# Patient Record
Sex: Female | Born: 1985 | Race: Black or African American | Hispanic: No | Marital: Married | Smoking: Never smoker
Health system: Southern US, Community
[De-identification: ages and names within clinical notes are randomized; demographics above are authoritative.]

## PROBLEM LIST (undated history)

## (undated) DIAGNOSIS — Z803 Family history of malignant neoplasm of breast: Secondary | ICD-10-CM

## (undated) DIAGNOSIS — G43909 Migraine, unspecified, not intractable, without status migrainosus: Secondary | ICD-10-CM

## (undated) HISTORY — PX: HERNIA REPAIR: SHX51

## (undated) HISTORY — DX: Family history of malignant neoplasm of breast: Z80.3

---

## 2010-02-10 ENCOUNTER — Emergency Department (HOSPITAL_COMMUNITY): Admission: EM | Admit: 2010-02-10 | Discharge: 2010-02-11 | Payer: Self-pay | Admitting: Emergency Medicine

## 2010-05-13 ENCOUNTER — Emergency Department (HOSPITAL_COMMUNITY)
Admission: EM | Admit: 2010-05-13 | Discharge: 2010-05-13 | Payer: Self-pay | Source: Home / Self Care | Admitting: Family Medicine

## 2010-08-25 LAB — POCT PREGNANCY, URINE: Preg Test, Ur: NEGATIVE

## 2010-08-25 LAB — POCT I-STAT, CHEM 8
BUN: 8 mg/dL (ref 6–23)
Chloride: 105 mEq/L (ref 96–112)
Sodium: 140 mEq/L (ref 135–145)

## 2011-01-31 IMAGING — CR DG CHEST 2V
2 series · 2 of 2 positions shown · non-contrast
Comparison: None.

CLINICAL DATA: Chest pain for 2 days.

CHEST - 2 VIEW

[w chest pa]
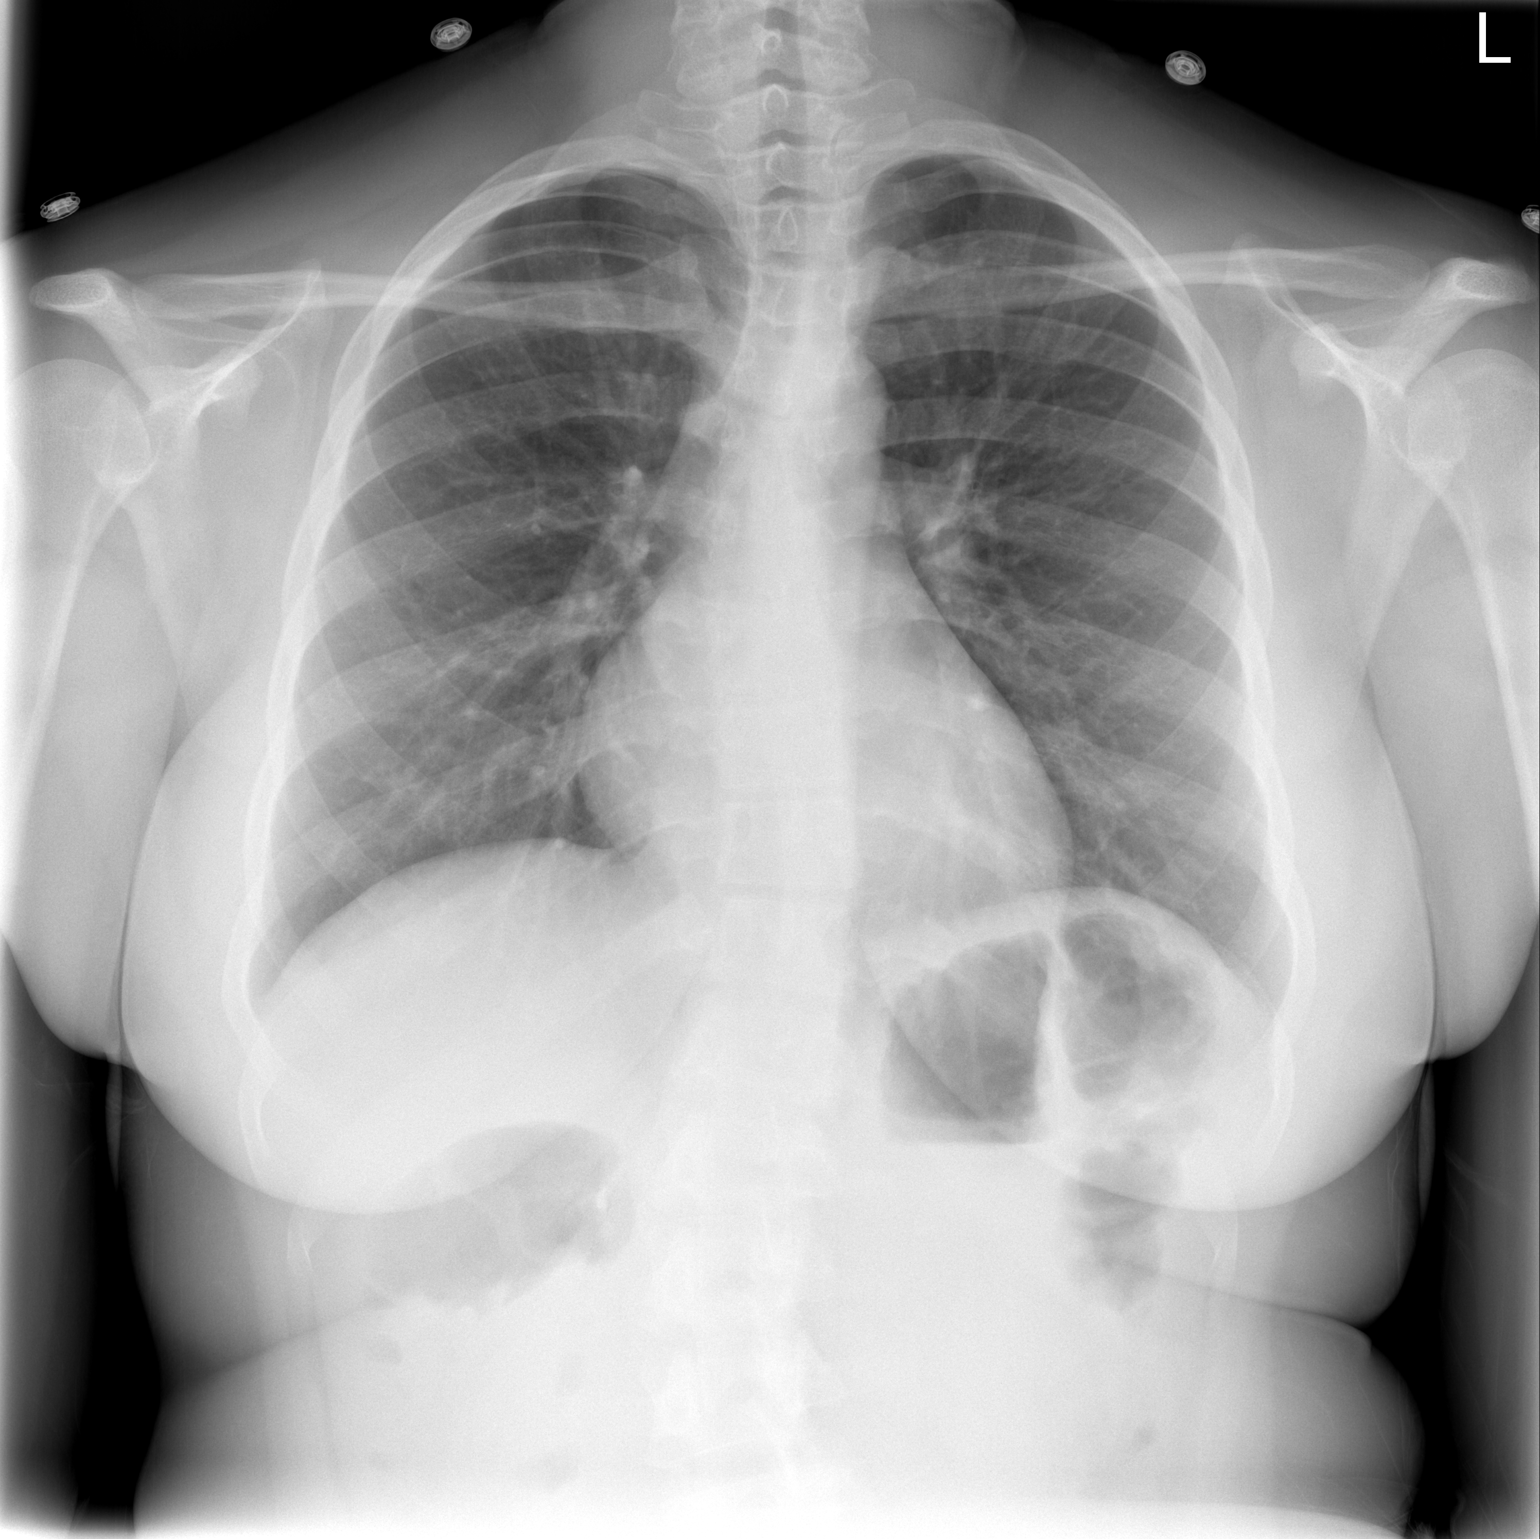

[w chest lat]
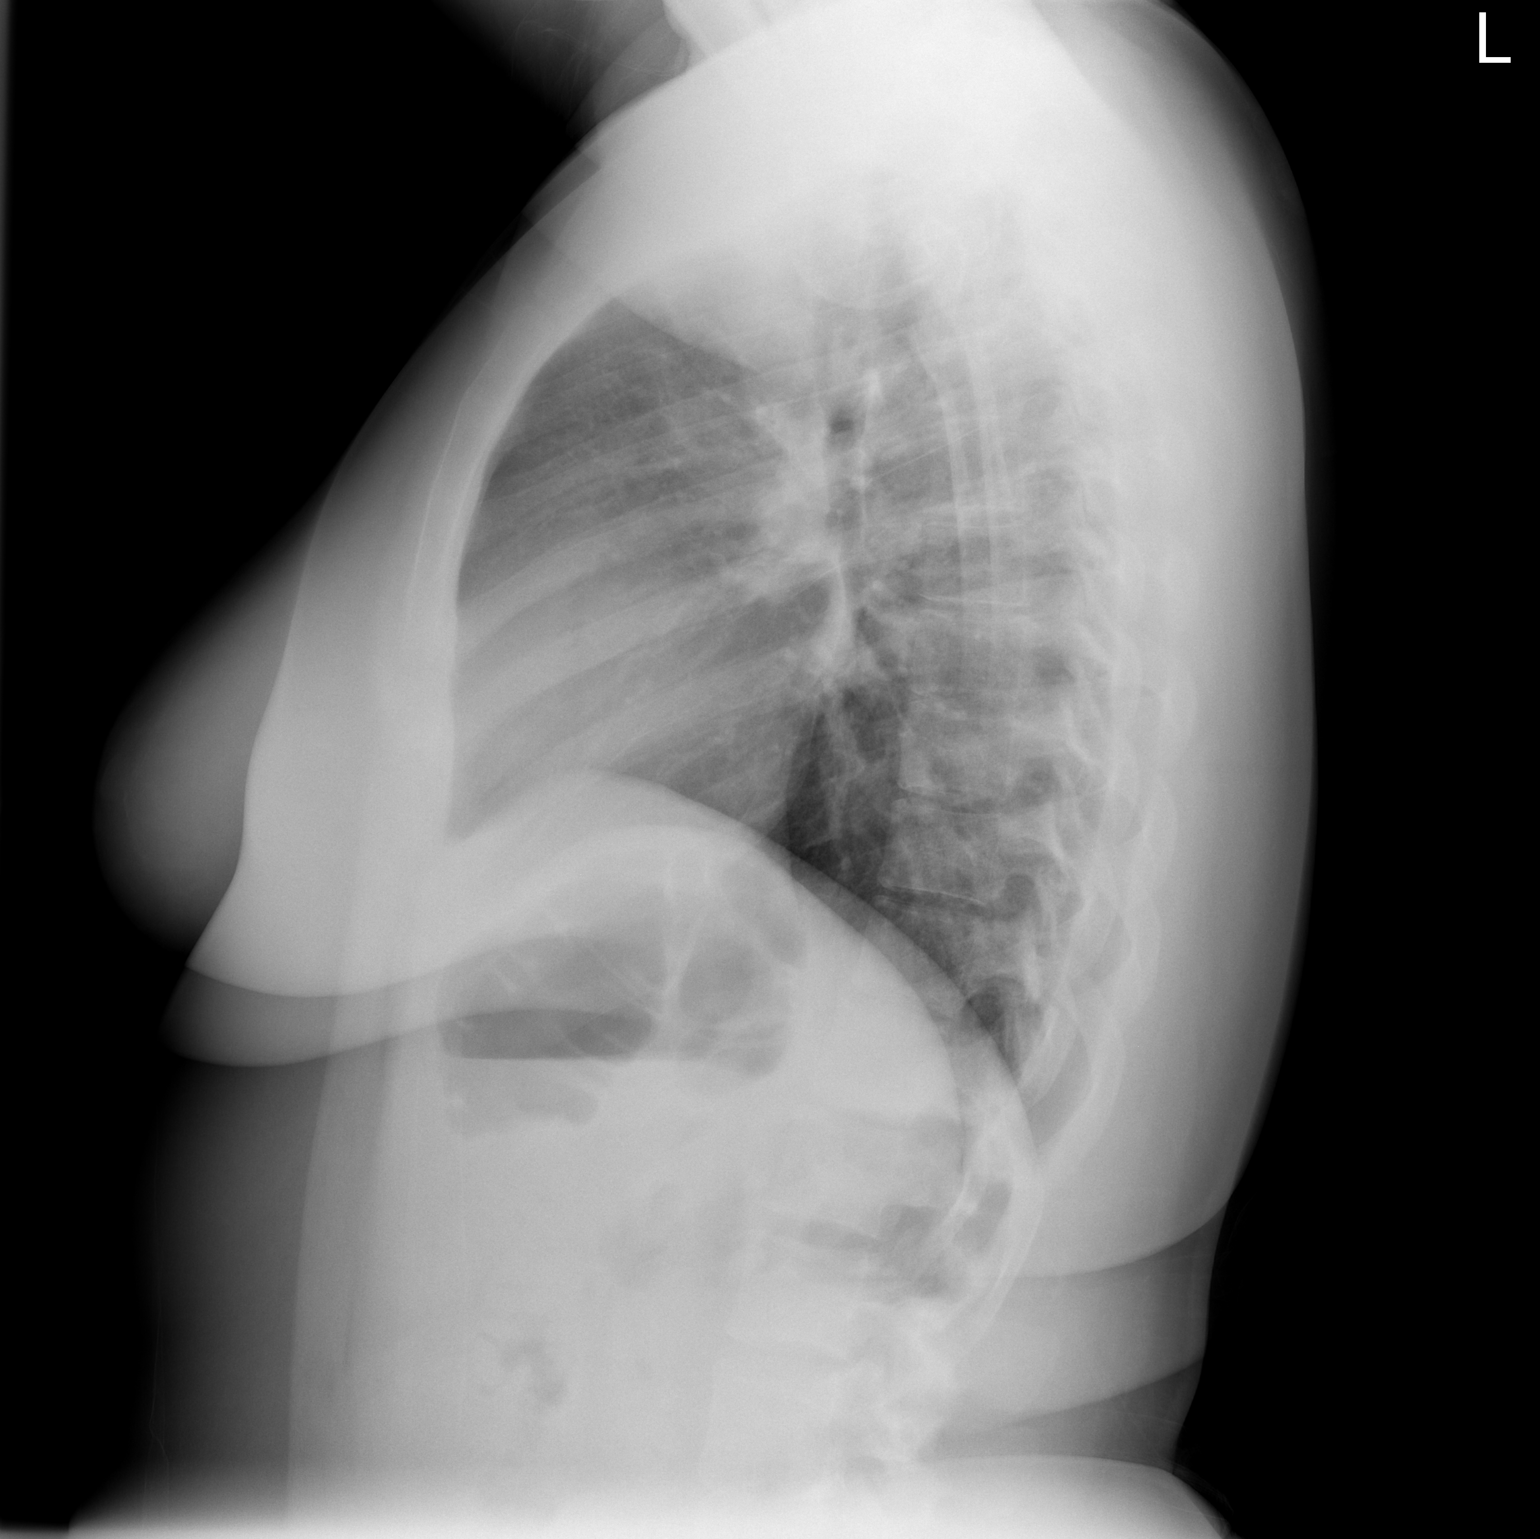

[2 of 2 positions shown; findings below may reference images not displayed]

FINDINGS: The heart size and mediastinal contours are normal. The
lungs are clear. There is no pleural effusion or pneumothorax. No
acute osseous findings are identified.
IMPRESSION: No active cardiopulmonary process.

## 2012-05-23 ENCOUNTER — Other Ambulatory Visit (HOSPITAL_COMMUNITY)
Admission: RE | Admit: 2012-05-23 | Discharge: 2012-05-23 | Disposition: A | Discharge status: Home / Self Care | Payer: Self-pay | Source: Ambulatory Visit | Attending: Emergency Medicine | Admitting: Emergency Medicine

## 2012-05-23 ENCOUNTER — Encounter (HOSPITAL_COMMUNITY): Payer: Self-pay | Admitting: Emergency Medicine

## 2012-05-23 ENCOUNTER — Emergency Department (INDEPENDENT_AMBULATORY_CARE_PROVIDER_SITE_OTHER)
Admission: EM | Admit: 2012-05-23 | Discharge: 2012-05-23 | Disposition: A | Discharge status: Home / Self Care | Payer: Self-pay | Source: Home / Self Care | Attending: Emergency Medicine | Admitting: Emergency Medicine

## 2012-05-23 DIAGNOSIS — Z113 Encounter for screening for infections with a predominantly sexual mode of transmission: Secondary | ICD-10-CM | POA: Insufficient documentation

## 2012-05-23 DIAGNOSIS — N760 Acute vaginitis: Secondary | ICD-10-CM

## 2012-05-23 MED ORDER — FLUCONAZOLE 150 MG PO TABS: 150.0000 mg | ORAL_TABLET | Freq: Once | ORAL | Status: DC

## 2012-05-23 MED ORDER — METRONIDAZOLE 500 MG PO TABS: 500.0000 mg | ORAL_TABLET | Freq: Two times a day (BID) | ORAL | Status: DC

## 2012-05-23 NOTE — ED Notes (Signed)
Patient in gown and equipment at bedside

## 2012-05-23 NOTE — ED Provider Notes (Signed)
Chief Complaint  Patient presents with  . Vaginal Pain    History of Present Illness:   The patient is a 26 year old female who has had a one-week history of vaginal irritation, burning, and itching. She denies any vaginal or pelvic pain, lower back pain, fever, chills, nausea, vomiting, or urinary symptoms. Her menses have been regular. Last menstrual period was 4 days ago. She is sexually active with use of condoms.  Review of Systems:  Other than noted above, the patient denies any of the following symptoms: Systemic:  No fever, chills, sweats, fatigue, or weight loss. GI:  No abdominal pain, nausea, anorexia, vomiting, diarrhea, constipation, melena or hematochezia. GU:  No dysuria, frequency, urgency, hematuria, vaginal discharge, itching, or abnormal vaginal bleeding. Skin:  No rash or itching.   PMFSH:  Past medical history, family history, social history, meds, and allergies were reviewed.  Physical Exam:   Vital signs:  BP 131/75  Pulse 82  Temp 99.2 F (37.3 C) (Oral)  Resp 16  SpO2 99%  LMP 05/22/2012 General:  Alert, oriented and in no distress. Lungs:  Breath sounds clear and equal bilaterally.  No wheezes, rales or rhonchi. Heart:  Regular rhythm.  No gallops or murmers. Abdomen:  Soft, flat and non-distended.  No organomegaly or mass.  No tenderness, guarding or rebound.  Bowel sounds normally active. Pelvic exam:  Normal external genitalia. Vaginal and cervical mucosa were normal. There was no pain on cervical motion. Uterus was midposition, normal in size and shape, without any adnexal masses or tenderness. Skin:  Clear, warm and dry.  Other Labs Obtained at Urgent Care Center:  DNA probes for gonorrhea, chlamydia, trichomonas, candida, and Gardnerella were obtained.  Results are pending at this time and we will call about any positive results.  Assessment:  The encounter diagnosis was Vaginitis.  Plan:   1.  The following meds were prescribed:   New  Prescriptions   FLUCONAZOLE (DIFLUCAN) 150 MG TABLET    Take 1 tablet (150 mg total) by mouth once.   METRONIDAZOLE (FLAGYL) 500 MG TABLET    Take 1 tablet (500 mg total) by mouth 2 (two) times daily.   2.  The patient was instructed in symptomatic care and handouts were given. 3.  The patient was told to return if becoming worse in any way, if no better in 3 or 4 days, and given some red flag symptoms that would indicate earlier return.    Reuben Likes, MD 05/23/12 2209

## 2012-05-23 NOTE — ED Notes (Signed)
Reports vaginal burning and irritation for 2 weeks.  Patient reports symptoms are worsening.  Denies vaginal discharge.  Reports back pain is the same as that experienced with her periods, no different.  Denies burning with urination

## 2012-06-19 ENCOUNTER — Telehealth (HOSPITAL_COMMUNITY): Payer: Self-pay | Admitting: *Deleted

## 2012-06-19 NOTE — ED Notes (Signed)
GC/Chlamydia neg., Affirm: Candida and Gardnerella neg.,  Trich pos. Pt. adequately treated with Flagyl.  I called pt.  Pt. verified x 2 and given results.  Pt. told she was adequately treated.  You need to notify your partner to be treated with Flagyl, no sex until your partner has been treated and to practice safe sex. You can get HIV testing at the [facility] clinic by appointment. Regina Davis 06/19/2012

## 2012-09-09 ENCOUNTER — Encounter (HOSPITAL_COMMUNITY): Payer: Self-pay | Admitting: Emergency Medicine

## 2012-09-09 DIAGNOSIS — Z3202 Encounter for pregnancy test, result negative: Secondary | ICD-10-CM | POA: Insufficient documentation

## 2012-09-09 DIAGNOSIS — G43909 Migraine, unspecified, not intractable, without status migrainosus: Secondary | ICD-10-CM | POA: Insufficient documentation

## 2012-09-09 DIAGNOSIS — R51 Headache: Secondary | ICD-10-CM | POA: Insufficient documentation

## 2012-09-09 DIAGNOSIS — Z8679 Personal history of other diseases of the circulatory system: Secondary | ICD-10-CM | POA: Insufficient documentation

## 2012-09-09 NOTE — ED Notes (Signed)
Patient complaining of headache/migraine that started today; history of migraine in the past.  Complaining of photopia, nausea, light headedness, and light sensitivity.

## 2012-09-10 ENCOUNTER — Emergency Department (HOSPITAL_COMMUNITY)
Admission: EM | Admit: 2012-09-10 | Discharge: 2012-09-10 | Disposition: A | Discharge status: Home / Self Care | Payer: BC Managed Care – PPO | Attending: Emergency Medicine | Admitting: Emergency Medicine

## 2012-09-10 DIAGNOSIS — Z3202 Encounter for pregnancy test, result negative: Secondary | ICD-10-CM | POA: Diagnosis not present

## 2012-09-10 DIAGNOSIS — R51 Headache: Secondary | ICD-10-CM | POA: Diagnosis not present

## 2012-09-10 DIAGNOSIS — Z8679 Personal history of other diseases of the circulatory system: Secondary | ICD-10-CM | POA: Diagnosis not present

## 2012-09-10 HISTORY — DX: Migraine, unspecified, not intractable, without status migrainosus: G43.909

## 2012-09-10 LAB — POCT PREGNANCY, URINE: Preg Test, Ur: POSITIVE — AB

## 2012-09-10 MED ORDER — KETOROLAC TROMETHAMINE 30 MG/ML IJ SOLN
30.0000 mg | Freq: Once | INTRAMUSCULAR | Status: AC
  Administered 2012-09-10: 30 mg via INTRAVENOUS
  Filled 2012-09-10: qty 1

## 2012-09-10 MED ORDER — ONDANSETRON HCL 4 MG/2ML IJ SOLN
4.0000 mg | Freq: Once | INTRAMUSCULAR | Status: AC
  Administered 2012-09-10: 4 mg via INTRAVENOUS
  Filled 2012-09-10: qty 2

## 2012-09-10 NOTE — ED Provider Notes (Signed)
History     CSN: [id number]  Arrival date & time 09/09/12  2242   First MD Initiated Contact with Patient 09/10/12 0028      Chief Complaint  Patient presents with  . Headache    (Consider location/radiation/quality/duration/timing/severity/associated sxs/prior treatment) Patient is a 27 y.o. female presenting with headaches. The history is provided by the patient (the pt complains of a headache). No language interpreter was used.  Headache Pain location:  Frontal Quality:  Dull Radiates to:  Does not radiate Severity currently:  4/10 Severity at highest:  8/10 Onset quality:  Gradual Timing:  Constant Progression:  Waxing and waning Associated symptoms: no abdominal pain, no back pain, no congestion, no cough, no diarrhea, no fatigue, no seizures and no sinus pressure     Past Medical History  Diagnosis Date  . Migraine     Past Surgical History  Procedure Laterality Date  . Hernia repair      History reviewed. No pertinent family history.  History  Substance Use Topics  . Smoking status: Never Smoker   . Smokeless tobacco: Not on file  . Alcohol Use: No    OB History   Grav Para Term Preterm Abortions TAB SAB Ect Mult Living                  Review of Systems  Constitutional: Negative for fatigue.  HENT: Negative for congestion, sinus pressure and ear discharge.   Eyes: Negative for discharge.  Respiratory: Negative for cough.   Cardiovascular: Negative for chest pain.  Gastrointestinal: Negative for abdominal pain and diarrhea.  Genitourinary: Negative for frequency and hematuria.  Musculoskeletal: Negative for back pain.  Skin: Negative for rash.  Neurological: Positive for headaches. Negative for seizures.  Psychiatric/Behavioral: Negative for hallucinations.    Allergies  Review of patient's allergies indicates no known allergies.  Home Medications  No current outpatient prescriptions on file.  BP 113/64  Pulse 86  Temp(Src) 98.1 F  (36.7 C) (Oral)  Resp 18  SpO2 100%  LMP 07/29/2012  Physical Exam  Constitutional: She is oriented to person, place, and time. She appears well-developed.  HENT:  Head: Normocephalic and atraumatic.  Eyes: Conjunctivae and EOM are normal. No scleral icterus.  Neck: Neck supple. No thyromegaly present.  Cardiovascular: Normal rate and regular rhythm.  Exam reveals no gallop and no friction rub.   No murmur heard. Pulmonary/Chest: No stridor. She has no wheezes. She has no rales. She exhibits no tenderness.  Abdominal: She exhibits no distension. There is no tenderness. There is no rebound.  Musculoskeletal: Normal range of motion. She exhibits no edema.  Lymphadenopathy:    She has no cervical adenopathy.  Neurological: She is oriented to person, place, and time. Coordination normal.  Skin: No rash noted. No erythema.  Psychiatric: She has a normal mood and affect. Her behavior is normal.    ED Course  Procedures (including critical care time)  Labs Reviewed  POCT PREGNANCY, URINE - Abnormal; Notable for the following:    Preg Test, Ur POSITIVE (*)    All other components within normal limits   No results found.   1. Headache       MDM          Benny Lennert, MD 09/10/12 [phone]

## 2012-09-10 NOTE — ED Notes (Signed)
Pt states "I think I could be pregnant and that is why I am getting these headaches." MD made aware.

## 2012-12-02 ENCOUNTER — Encounter: Payer: Self-pay | Admitting: Obstetrics & Gynecology

## 2013-05-03 ENCOUNTER — Inpatient Hospital Stay (HOSPITAL_COMMUNITY)
Admission: AD | Admit: 2013-05-03 | Payer: BC Managed Care – PPO | Source: Ambulatory Visit | Admitting: Obstetrics and Gynecology

## 2013-08-24 ENCOUNTER — Encounter (HOSPITAL_COMMUNITY): Payer: Self-pay | Admitting: Emergency Medicine

## 2013-08-24 ENCOUNTER — Emergency Department (HOSPITAL_COMMUNITY)
Admission: EM | Admit: 2013-08-24 | Discharge: 2013-08-24 | Disposition: A | Discharge status: Home / Self Care | Payer: Medicaid Other | Attending: Emergency Medicine | Admitting: Emergency Medicine

## 2013-08-24 DIAGNOSIS — N61 Mastitis without abscess: Secondary | ICD-10-CM | POA: Insufficient documentation

## 2013-08-24 DIAGNOSIS — Z8679 Personal history of other diseases of the circulatory system: Secondary | ICD-10-CM | POA: Insufficient documentation

## 2013-08-24 MED ORDER — CEPHALEXIN 500 MG PO CAPS: 500.0000 mg | ORAL_CAPSULE | Freq: Four times a day (QID) | ORAL | Status: DC

## 2013-08-24 NOTE — ED Notes (Signed)
The  Pt is c/o rt breast pain for several days.  Now she thinks she has a boil on it.  She is breast feeding but has not feed the baby from this breast today

## 2013-08-24 NOTE — Discharge Instructions (Signed)
1. Medications: keflex, usual home medications 2. Treatment: rest, drink plenty of fluids,  3. Follow Up: Please followup with your primary doctor or OB/GYN for discussion of your diagnoses and further evaluation after today's visit; if you do not have a primary care doctor use the resource guide provided to find one;    Breastfeeding and Mastitis Mastitis is inflammation of the breast tissue. It can occur in women who are breastfeeding. This can make breastfeeding painful. Mastitis will sometimes go away on its own. Your health care provider will help determine if treatment is needed. CAUSES Mastitis is often associated with a blocked milk (lactiferous) duct. This can happen when too much milk builds up in the breast. Causes of excess milk in the breast can include:  Poor latch-on. If your baby is not latched onto the breast properly, she or he may not empty your breast completely while breastfeeding.  Allowing too much time to pass between feedings.  Wearing a bra or other clothing that is too tight. This puts extra pressure on the lactiferous ducts so milk does not flow through them as it should. Mastitis can also be caused by a bacterial infection. Bacteria may enter the breast tissue through cuts or openings in the skin. In women who are breastfeeding, this may occur because of cracked or irritated skin. Cracks in the skin are often caused when your baby does not latch on properly to the breast. SIGNS AND SYMPTOMS  Swelling, redness, tenderness, and pain in an area of the breast.  Swelling of the glands under the arm on the same side.  Fever may or may not accompany mastitis. If an infection is allowed to progress, a collection of pus (abscess) may develop. DIAGNOSIS  Your health care provider can usually diagnose mastitis based on your symptoms and a physical exam. Tests may be done to help confirm the diagnosis. These may include:  Removal of pus from the breast by applying pressure  to the area. This pus can be examined in the lab to determine which bacteria are present. If an abscess has developed, the fluid in the abscess can be removed with a needle. This can also be used to confirm the diagnosis and determine the bacteria present. In most cases, pus will not be present.  Blood tests to determine if your body is fighting a bacterial infection.  Mammogram or ultrasound tests to rule out other problems or diseases. TREATMENT  Mastitis that occurs with breastfeeding will sometimes go away on its own. Your health care provider may choose to wait 24 hours after first seeing you to decide whether a prescription medicine is needed. If your symptoms are worse after 24 hours, your health care provider will likely prescribe an antibiotic to treat the mastitis. He or she will determine which bacteria are most likely causing the infection and will then select an appropriate antibiotic. This is sometimes changed based on the results of tests performed to identify the bacteria, or if there is no response to the antibiotic selected. Antibiotics are usually given by mouth. You may also be given medicine for pain. HOME CARE INSTRUCTIONS  Only take over-the-counter or prescription medicines for pain, fever, or discomfort as directed by your health care provider.  If your health care provider prescribed an antibiotic, take the medicine as directed. Make sure you finish it even if you start to feel better.  Do not wear a tight or underwire bra. Wear a soft, supportive bra.  Increase your fluid intake, especially  if you have a fever.  Continue to empty the breast. Your health care provider can tell you whether this milk is safe for your infant or needs to be thrown out. You may be told to stop nursing until your health care provider thinks it is safe for your baby. Use a breast pump if you are advised to stop nursing.  Keep your nipples clean and dry.  Empty the first breast completely  before going to the other breast. If your baby is not emptying your breasts completely for some reason, use a breast pump to empty your breasts.  If you go back to work, pump your breasts while at work to stay in time with your nursing schedule.  Avoid allowing your breasts to become overly filled with milk (engorged). SEEK MEDICAL CARE IF:  You have pus-like discharge from the breast.  Your symptoms do not improve with the treatment prescribed by your health care provider within 2 days. SEEK IMMEDIATE MEDICAL CARE IF:  Your pain and swelling are getting worse.  You have pain that is not controlled with medicine.  You have a red line extending from the breast toward your armpit.  You have a fever or persistent symptoms for more than 2 3 days.  You have a fever and your symptoms suddenly get worse. MAKE SURE YOU:   Understand these instructions.  Will watch your condition.  Will get help right away if you are not doing well or get worse. Document Released: 09/23/2004 Document Revised: 01/29/2013 Document Reviewed: 01/02/2013 [facility] Patient Information 2014 [place].    Emergency Department Resource Guide 1) Find a Doctor and Pay Out of Pocket Although you won't have to find out who is covered by your insurance plan, it is a good idea to ask around and get recommendations. You will then need to call the office and see if the doctor you have chosen will accept you as a new patient and what types of options they offer for patients who are self-pay. Some doctors offer discounts or will set up payment plans for their patients who do not have insurance, but you will need to ask so you aren't surprised when you get to your appointment.  2) Contact Your Local Health Department Not all health departments have doctors that can see patients for sick visits, but many do, so it is worth a call to see if yours does. If you don't know where your local health department is, you can  check in your phone book. The CDC also has a tool to help you locate your state's health department, and many state websites also have listings of all of their local health departments.  3) Find a Walk-in Clinic If your illness is not likely to be very severe or complicated, you may want to try a walk in clinic. These are popping up all over the country in pharmacies, drugstores, and shopping centers. They're usually staffed by nurse practitioners or physician assistants that have been trained to treat common illnesses and complaints. They're usually fairly quick and inexpensive. However, if you have serious medical issues or chronic medical problems, these are probably not your best option.  No Primary Care Doctor: - Call Health Connect at  [phone] - they can help you locate a primary care doctor that  accepts your insurance, provides certain services, etc. - Physician Referral Service- [phone]  Chronic Pain Problems: Organization         Address  Phone   Notes  [facility]  [phone] Patients need to be referred by their primary care doctor.   Medication Assistance: Organization         Address  Phone   Notes  [facility] Medication [facility] [address] [place] [place], [place] [address] [phone] --Must be a resident of [facility] -- Must have NO insurance coverage whatsoever (no Medicaid/ Medicare, etc.) -- The pt. MUST have a primary care doctor that directs their care regularly and follows them in the community   [facility]  [phone]   [employer]  [phone]    Agencies that provide inexpensive medical care: Organization         Address  Phone   Notes  [facility]  [phone]   [facility]    [phone]   [facility] [address] [place], [place] [address] [phone]   [facility] [address] [place], [place] [phone]    Planned Parenthood    [phone]   [facility]    [phone]   [facility]  [address], [place] Phone:  [phone], Fax:  [phone] Hours of Operation:  9 am - 6 pm, M-F.  Also accepts Medicaid/Medicare and self-pay.  [facility]  [address], [place] Phone: [phone], Fax: [phone]. Hours of Operation:  8:30 am - 5:30 pm, M-F.  Also accepts Medicaid and self-pay.  [facility] [place] [address] [place] Phone: [phone]   [facility] [address] Natasha Bence [place], [place] [phone], Ext. [phone] Mondays & Thursdays: 7-9 AM.  First 15 patients are seen on a first come, first serve basis.    Medicaid-accepting [facility] Providers:  Organization         Address  Phone   Notes  [facility] [address], [place] [phone] Also accepts self-pay patients.  [facility] [address] Laurell Josephs [place], [place]  [phone]   [facility] [address], [place] [phone]   [facility] [address] [place] [phone]   Renaye Rakers [address], [place]   [phone] Only accepts [place] Access [place] patients after they have their name applied to their card.   Self-Pay (no insurance) in [facility]:  Organization         Address  Phone   Notes  Sickle Cell Patients, [facility] Internal Medicine [address] [place], [place] [phone]   [facility] [address] [place], [place] [phone]   [facility] [place]  [address], [place] [phone]   [facility]/Dr. Osei-Bonsu  [address] [place] or [address], [place] [phone] Phone number for both [place] and [place] locations is the  same.  [facility] [address] [place] [phone]   [facility] [address] [place] or [address] [phone]   [facility] [address], [place] [phone], phone; [phone], fax Sees patients 1st and 3rd Saturday of every month.  Must not qualify for public or private insurance (i.e. Medicaid, Medicare,  [place] Health Choice, Veterans' Benefits)  Household income should be no more than 200% of the poverty level The clinic cannot treat you if you are pregnant or think you are pregnant  Sexually transmitted diseases are not treated at the clinic.    Dental Care: Organization         Address  Phone  Notes  [facility] Department of [facility] [address] [place], [place] [phone] Accepts children up to age 66 who are enrolled in [place] or [place] Health Choice; pregnant women with a Medicaid card; and children who have applied for Medicaid or [place] Health Choice, but were declined, whose parents can pay a reduced fee at time of service.  [facility] of [facility]  [address], [place] [phone] Accepts children up to age 2 who are enrolled in [place] or [place] Health Choice; pregnant women with a Medicaid card; and children who have applied for Medicaid or [place] Health Choice, but were declined, whose parents can pay a reduced fee at time of service.  [facility]  [address] [place], [place] [phone] Patients are seen by appointment only. Walk-ins are not accepted. [facility] will see patients 54 years of age and older. Monday - Tuesday (8am-5pm) Most Wednesdays (8:30-5pm) $30 per visit, cash only  [facility]  [address], [facility] [phone] Patients are seen by appointment only. Walk-ins are not accepted. [facility] will see patients  37 years of age and older. One Wednesday Evening (Monthly: Volunteer Based).  $30 per visit, cash only  [employer]  [phone] for adults; Children under age 70, call Graduate Pediatric Dentistry at [phone]. Children aged 71-14, please call [phone] to request a pediatric application.  Dental services are provided in all areas of dental care including fillings, crowns and bridges, complete and partial dentures, implants, gum treatment, root canals, and extractions. Preventive care is also provided. Treatment is provided to both adults and children. Patients are selected via a lottery and there is often a waiting list.   [facility] [address], [place]  [phone] [email or web address]   [facility] [address] [place], [place] [phone], Ext. [phone] Second and Fourth Thursday of each month, opens at 6:30 AM; Clinic ends at 9 AM.  Patients are seen on a first-come first-served basis, and a limited number are seen during each clinic.   [facility]  [address] Ether Griffins [place], [place] [phone]   Eligibility Requirements You must have lived in [place], [place], or [place] counties for at least the last three months.   You cannot be eligible for state or federal sponsored [place], including [employer], [place], or [employer].   You generally cannot be eligible for healthcare insurance through your employer.    How to apply: Eligibility screenings are held every Tuesday and Wednesday afternoon from 1:00 pm until 4:00 pm. You do not need an appointment for the interview!  [facility] [address], [place], [place] [phone]   [facility] Health Department  [phone]   [facility] Health Department  [phone]   [facility] Health Department  [phone]    Behavioral Health Resources in the Community: Intensive Outpatient  Programs Organization         Address  Phone  Notes  [facility]  [address], [place], [place] [phone]   [facility] [address] [place], [place] [phone]   [facility] [address] [place], [place]  [phone]   [facility] Mental Health [address]  [place], [place] [phone] or [phone]   Substance Abuse Resources Organization         Address  Phone  Notes  [facility]  [phone]   [facility]  [phone]   [facility]  [phone]   Floydene Flock  [phone]   Residential & Outpatient Substance Abuse Program  [phone]   Psychological Services Organization         Address  Phone  Notes  [facility] Behavioral Health  [phone]   [facility]  [phone]   [facility] Mental Health [address] [place] [phone] or [phone]    Mobile Crisis Teams Organization         Address  Phone  Notes  [facility], Mobile Crisis Care Unit  [phone]   [facility]  [address] [place], [place] [phone]   Doristine Locks [address] [place] [phone]    Self-Help/Support Groups Organization         Address  Phone             Notes  [facility] [place] - variety of support groups  [phone]- [phone] Call for more information  Narcotics Anonymous (NA), [facility] [address], [employer] [place]  2 meetings at this location   Statistician         Address  Phone  Notes  [facility] [address],    [place]  [phone]   [facility]  [address] [place] [address], [place], [place] [phone]   [facility] [address] [place], [place] [phone] Admissions: 8am-3pm M-F  [facility] [address]    [place], [place]  [phone]   [facility] [address] [place], [place], [place] [phone]   [facility] [address],  [place], [place] [phone]   [facility] - Intensive Outpatient [address] Laurell Josephs [address], [place], [place] [phone]   [facility] ([facility]iction [facility] [address] [place]  [place], [place] [phone] or [phone]   [facility] ([facility]) [address], [place], [place] [phone] Accepts Medicaid  [facility] [address],  [place] [phone] Substance Abuse/Addiction Treatment   [facility] Organization         Address  Phone  Notes  [facility]  [phone]   Angie Fava, PhD [address] Ervin Knack [place], [place]   [phone] or [phone]   [facility]   [address] [place], [place] [phone]   [facility] [address] [place], [place] [phone] Insurance/Medicaid/sponsorship through [facility] [address]                                    [place], [place] [phone] Therapy/tele-psych/case  [facility] [address], [place] [phone]    Dr. Lolly Mustache  [phone]   [facility]  [employer] [facility] Dept. 1) [address] [place] 2) [address] [place] 3)  [address], [place] [phone]  [phone]   [facility] Child Abuse Hotline (410)838-3799 or [phone] (After Hours)

## 2013-08-24 NOTE — ED Provider Notes (Signed)
CSN: [id number]     Arrival date & time 08/24/13  2041 History   First MD Initiated Contact with Patient 08/24/13 2118     Chief Complaint  Patient presents with  . Breast Pain     (Consider location/radiation/quality/duration/timing/severity/associated sxs/prior Treatment) The history is provided by the patient and medical records. No language interpreter was used.    Regina Davis is a 28 y.o. female  with a hx of migraine presents to the Emergency Department complaining of gradual, persistent, progressively worsening pain, redness and warmth to the right outer breast onset 2 days ago.  She reports that she has an infant at home and she is breast-feeding.  She reports the baby is beginning to teeth and has been biting more often.  She also reports she had a "milk plugged" last week which resolved on its own.  She denies fevers or chills, nausea or vomiting. She reports sitting her breasts and Epson salt which has not improved the pain.  She has not attempted breast feeding the right breast today.    Past Medical History  Diagnosis Date  . Migraine    Past Surgical History  Procedure Laterality Date  . Hernia repair     No family history on file. History  Substance Use Topics  . Smoking status: Never Smoker   . Smokeless tobacco: Not on file  . Alcohol Use: No   OB History   Grav Para Term Preterm Abortions TAB SAB Ect Mult Living                 Review of Systems  Constitutional: Negative for fever and chills.  Gastrointestinal: Negative for nausea and vomiting.  Genitourinary:       Breast pain  Skin: Positive for color change and wound. Negative for rash.  Allergic/Immunologic: Negative for immunocompromised state.  Hematological: Does not bruise/bleed easily.  Psychiatric/Behavioral: The patient is not nervous/anxious.       Allergies  Review of patient's allergies indicates no known allergies.  Home Medications   Current Outpatient Rx  Name  Route  Sig   Dispense  Refill  . cephALEXin (KEFLEX) 500 MG capsule   Oral   Take 1 capsule (500 mg total) by mouth 4 (four) times daily.   40 capsule   0    BP 108/59  Pulse 86  Temp(Src) 99.2 F (37.3 C) (Oral)  Resp 18  SpO2 99% Physical Exam  Nursing note and vitals reviewed. Constitutional: She is oriented to person, place, and time. She appears well-developed and well-nourished. No distress.  HENT:  Head: Normocephalic and atraumatic.  Eyes: Conjunctivae are normal. No scleral icterus.  Neck: Normal range of motion.  Cardiovascular: Normal rate, regular rhythm, normal heart sounds and intact distal pulses.   Regular rate and rhythm No tachycardia  Pulmonary/Chest: Effort normal and breath sounds normal.    Clear and equal breath sounds Area of erythema, redness and mild induration to the right upper outer quadrant of the breast extending to the nipple Small 1 cm x 1 cm area of erythema without induration which has a very small crusted ulceration at the center consistent with a small abscess that is now draining  Abdominal: Soft. She exhibits no distension. There is no tenderness.  Abdomen soft and nontender  Genitourinary: There is breast swelling and tenderness. No breast discharge or bleeding.  Area of erythema, redness and mild induration to the right upper outer quadrant of the breast extending to the nipple Small 1 cm  x 1 cm area of erythema without induration which has a very small crusted ulceration at the center consistent with a small abscess that is now draining No bloody discharge from the nipple No palpable mass  Lymphadenopathy:    She has no cervical adenopathy.    She has axillary adenopathy.       Right axillary: Pectoral adenopathy present. No lateral adenopathy present.       Left axillary: No pectoral and no lateral adenopathy present. Mild pectoral axilla adenopathy; lymph nodes are tender, discrete and mobile  Neurological: She is alert and oriented to person,  place, and time.  Skin: Skin is warm and dry. She is not diaphoretic. There is erythema.  Psychiatric: She has a normal mood and affect.    ED Course  Procedures (including critical care time) Labs Review Labs Reviewed - No data to display Imaging Review No results found.   EKG Interpretation None      MDM   Final diagnoses:  Acute mastitis of right breast   Lorenza Davis patient history and physical consistent with mastitis.  One very small lesion consistent with small abscess which is draining and appears to be healing. No induration at the site.  No palpable masses or bloody discharge from the nipple.  Very mild axilla lymphadenopathy on the right.  She alert, oriented, nontoxic and nonseptic appearing. She is afebrile and non-tachycardic.  Will give Keflex.  I have encouraged warm compresses and persistent breast-feeding.  Recommend close followup with her OB/GYN or primary care provider.  It has been determined that no acute conditions requiring further emergency intervention are present at this time. The patient/guardian have been advised of the diagnosis and plan. We have discussed signs and symptoms that warrant return to the ED, such as changes or worsening in symptoms.   Vital signs are stable at discharge.   BP 108/59  Pulse 86  Temp(Src) 99.2 F (37.3 C) (Oral)  Resp 18  SpO2 99%  Patient/guardian has voiced understanding and agreed to follow-up with the PCP or specialist.      Dierdre Forth, PA-C 08/24/13 2159

## 2013-08-24 NOTE — ED Provider Notes (Signed)
Medical screening examination/treatment/procedure(s) were performed by non-physician practitioner and as supervising physician I was immediately available for consultation/collaboration.   EKG Interpretation None        Verlena Marlette B. Bernette MayersSheldon, MD 08/24/13 2200

## 2015-06-13 NOTE — L&D Delivery Note (Signed)
Delivery Note At 12:16 AM a viable female was delivered via Vaginal, Spontaneous Delivery (Presentation:vertex ;LOA  ).  APGAR: 8, 8; weight  .   Placenta status: spont, via shultz.  Cord: 3vc with the following complications:none .  Cord pH: n/a  Anesthesia:  epidural Episiotomy: None Lacerations: None Suture Repair: n/a Est. Blood Loss (mL): 150  Mom to postpartum.  Baby to Couplet care / Skin to Skin.  Regina Davis 05/22/2016, 12:24 AM

## 2015-10-11 ENCOUNTER — Ambulatory Visit: Payer: Medicaid Other | Admitting: Adult Health

## 2015-11-10 ENCOUNTER — Other Ambulatory Visit: Payer: Self-pay | Admitting: Obstetrics and Gynecology

## 2015-11-10 DIAGNOSIS — O3680X0 Pregnancy with inconclusive fetal viability, not applicable or unspecified: Secondary | ICD-10-CM

## 2015-11-15 ENCOUNTER — Other Ambulatory Visit: Payer: Medicaid Other

## 2015-11-26 ENCOUNTER — Ambulatory Visit (INDEPENDENT_AMBULATORY_CARE_PROVIDER_SITE_OTHER): Payer: Medicaid Other

## 2015-11-26 ENCOUNTER — Other Ambulatory Visit: Payer: Self-pay | Admitting: Obstetrics and Gynecology

## 2015-11-26 DIAGNOSIS — O3680X0 Pregnancy with inconclusive fetal viability, not applicable or unspecified: Secondary | ICD-10-CM | POA: Diagnosis not present

## 2015-11-26 DIAGNOSIS — Z3A16 16 weeks gestation of pregnancy: Secondary | ICD-10-CM | POA: Diagnosis not present

## 2015-11-26 NOTE — Progress Notes (Signed)
US 15+5 wks,single IUP,pos fht 158 bpm,normal ov's bilat,svp of fluid 4.8 cm,post pl gr 0,cx 4.3 cm

## 2015-12-08 ENCOUNTER — Encounter: Payer: Self-pay | Admitting: Women's Health

## 2015-12-08 ENCOUNTER — Ambulatory Visit (INDEPENDENT_AMBULATORY_CARE_PROVIDER_SITE_OTHER): Payer: Medicaid Other | Admitting: Women's Health

## 2015-12-08 ENCOUNTER — Other Ambulatory Visit: Payer: Self-pay | Admitting: Women's Health

## 2015-12-08 VITALS — BP 124/62 | HR 84 | Ht 67.5 in | Wt 226.0 lb

## 2015-12-08 DIAGNOSIS — Z3482 Encounter for supervision of other normal pregnancy, second trimester: Secondary | ICD-10-CM | POA: Diagnosis not present

## 2015-12-08 DIAGNOSIS — Z3490 Encounter for supervision of normal pregnancy, unspecified, unspecified trimester: Secondary | ICD-10-CM | POA: Insufficient documentation

## 2015-12-08 DIAGNOSIS — Z369 Encounter for antenatal screening, unspecified: Secondary | ICD-10-CM

## 2015-12-08 DIAGNOSIS — Z363 Encounter for antenatal screening for malformations: Secondary | ICD-10-CM

## 2015-12-08 DIAGNOSIS — O0932 Supervision of pregnancy with insufficient antenatal care, second trimester: Secondary | ICD-10-CM

## 2015-12-08 DIAGNOSIS — Z3A18 18 weeks gestation of pregnancy: Secondary | ICD-10-CM

## 2015-12-08 DIAGNOSIS — Z0283 Encounter for blood-alcohol and blood-drug test: Secondary | ICD-10-CM

## 2015-12-08 DIAGNOSIS — Z1379 Encounter for other screening for genetic and chromosomal anomalies: Secondary | ICD-10-CM

## 2015-12-08 DIAGNOSIS — Z1389 Encounter for screening for other disorder: Secondary | ICD-10-CM

## 2015-12-08 DIAGNOSIS — Z3492 Encounter for supervision of normal pregnancy, unspecified, second trimester: Secondary | ICD-10-CM

## 2015-12-08 DIAGNOSIS — O0930 Supervision of pregnancy with insufficient antenatal care, unspecified trimester: Secondary | ICD-10-CM | POA: Insufficient documentation

## 2015-12-08 DIAGNOSIS — Z331 Pregnant state, incidental: Secondary | ICD-10-CM

## 2015-12-08 MED ORDER — PRENATAL PLUS 27-1 MG PO TABS: 1.0000 | ORAL_TABLET | Freq: Every day | ORAL | Status: DC

## 2015-12-08 NOTE — Patient Instructions (Signed)

## 2015-12-08 NOTE — Progress Notes (Signed)
  Subjective:  Regina Davis is a 30 y.o. 263P2002 African American female at 8541w3d by 15wk u/s, being seen today for her first obstetrical visit.  Her obstetrical history is significant for [facility]com[facility]  Pregnancy history fully reviewed.  Patient reports no complaints. Denies vb, cramping, uti s/s, abnormal/malodorous vag d/c, or vulvovaginal itching/irritation.  BP 124/62 mmHg  Pulse 84  Wt 226 lb (102.513 kg)  LMP 08/17/2015 (Approximate)  HISTORY: OB History  Gravida Para Term Preterm AB SAB TAB Ectopic Multiple Living  3 2 2       2     # Outcome Date GA Lbr Len/2nd Weight Sex Delivery Anes PTL Lv  3 Current           2 Term 10/26/14 6586w0d  7 lb 9 oz (3.43 kg) M Vag-Spont EPI N Y  1 Term 05/03/13 7799w0d  7 lb 9 oz (3.43 kg) F Vag-Spont EPI N Y     Past Medical History  Diagnosis Date  . Migraine    Past Surgical History  Procedure Laterality Date  . Hernia repair     Family History  Problem Relation Age of Onset  . Hypertension Mother   . Cancer Maternal Grandmother     breast    Exam   System:     General: Well developed & nourished, no acute distress   Skin: Warm & dry, normal coloration and turgor, no rashes   Neurologic: Alert & oriented, normal mood   Cardiovascular: Regular rate & rhythm   Respiratory: Effort & rate normal, LCTAB, acyanotic   Abdomen: Soft, non tender   Extremities: normal strength, tone  Thin prep pap smear neg 2016 in [place] FHR: 166 via doppler   Assessment:   Pregnancy: [id number] Patient Active Problem List   Diagnosis Date Noted  . Supervision of normal pregnancy 12/08/2015    Priority: High  . Late prenatal care 12/08/2015  . Migraine     2741w3d G3P2002 New OB visit Late to care  Plan:  Initial labs drawn including AFP Continue prenatal vitamins Problem list reviewed and updated Reviewed n/v relief measures and warning s/s to report Reviewed recommended weight gain based on pre-gravid  BMI Encouraged well-balanced diet Genetic Screening discussed Quad Screen: requested and ordered Cystic fibrosis screening discussed declined Ultrasound discussed; fetal survey: requested Follow up in 3 weeks for anatomy u/s and visit [facility] completed  Marge DuncansBooker, Kareem Aul Randall CNM, [id number] 12/08/2015 12:37 PM

## 2015-12-09 LAB — ABO/RH: RH TYPE: POSITIVE

## 2015-12-09 LAB — CBC
HEMATOCRIT: 34.9 % (ref 34.0–46.6)
HEMOGLOBIN: 11.3 g/dL (ref 11.1–15.9)
MCH: 28.5 pg (ref 26.6–33.0)
MCHC: 32.4 g/dL (ref 31.5–35.7)
MCV: 88 fL (ref 79–97)
Platelets: 330 10*3/uL (ref 150–379)
RBC: 3.96 x10E6/uL (ref 3.77–5.28)
RDW: 13.9 % (ref 12.3–15.4)
WBC: 8.7 10*3/uL (ref 3.4–10.8)

## 2015-12-09 LAB — PMP SCREEN PROFILE (10S), URINE

## 2015-12-09 LAB — SICKLE CELL SCREEN

## 2015-12-09 LAB — URINALYSIS, ROUTINE W REFLEX MICROSCOPIC
Bilirubin, UA: NEGATIVE
Glucose, UA: NEGATIVE
LEUKOCYTES UA: NEGATIVE
Nitrite, UA: NEGATIVE
PH UA: 7.5 (ref 5.0–7.5)
RBC UA: NEGATIVE
Specific Gravity, UA: 1.028 (ref 1.005–1.030)
Urobilinogen, Ur: 1 mg/dL (ref 0.2–1.0)

## 2015-12-09 LAB — HIV ANTIBODY (ROUTINE TESTING W REFLEX): HIV Screen 4th Generation wRfx: NONREACTIVE

## 2015-12-09 LAB — HEPATITIS B SURFACE ANTIGEN: Hepatitis B Surface Ag: NEGATIVE

## 2015-12-09 LAB — RUBELLA SCREEN: RUBELLA: 9.57 {index} (ref >0.99)

## 2015-12-09 LAB — GC/CHLAMYDIA PROBE AMP
Chlamydia trachomatis, NAA: NEGATIVE
Neisseria gonorrhoeae by PCR: NEGATIVE

## 2015-12-09 LAB — VARICELLA ZOSTER ANTIBODY, IGG: Varicella zoster IgG: >4000 index (ref >165)

## 2015-12-09 LAB — URINE CULTURE: Organism ID, Bacteria: NO GROWTH

## 2015-12-09 LAB — RPR: RPR: NONREACTIVE

## 2015-12-09 LAB — ANTIBODY SCREEN: Antibody Screen: NEGATIVE

## 2015-12-09 LAB — AFP TUMOR MARKER: AFP TUMOR MARKER: 42.1 ng/mL — AB (ref 0.0–8.3)

## 2015-12-29 ENCOUNTER — Ambulatory Visit (INDEPENDENT_AMBULATORY_CARE_PROVIDER_SITE_OTHER): Payer: Medicaid Other | Admitting: Obstetrics and Gynecology

## 2015-12-29 ENCOUNTER — Ambulatory Visit (INDEPENDENT_AMBULATORY_CARE_PROVIDER_SITE_OTHER): Payer: Medicaid Other

## 2015-12-29 ENCOUNTER — Encounter: Payer: Self-pay | Admitting: Obstetrics and Gynecology

## 2015-12-29 VITALS — BP 110/76 | HR 80 | Wt 226.0 lb

## 2015-12-29 DIAGNOSIS — O321XX1 Maternal care for breech presentation, fetus 1: Secondary | ICD-10-CM

## 2015-12-29 DIAGNOSIS — Z36 Encounter for antenatal screening of mother: Secondary | ICD-10-CM | POA: Diagnosis not present

## 2015-12-29 DIAGNOSIS — Z331 Pregnant state, incidental: Secondary | ICD-10-CM

## 2015-12-29 DIAGNOSIS — Z3482 Encounter for supervision of other normal pregnancy, second trimester: Secondary | ICD-10-CM

## 2015-12-29 DIAGNOSIS — Z1389 Encounter for screening for other disorder: Secondary | ICD-10-CM

## 2015-12-29 DIAGNOSIS — Z363 Encounter for antenatal screening for malformations: Secondary | ICD-10-CM

## 2015-12-29 DIAGNOSIS — Z3492 Encounter for supervision of normal pregnancy, unspecified, second trimester: Secondary | ICD-10-CM

## 2015-12-29 DIAGNOSIS — Z369 Encounter for antenatal screening, unspecified: Secondary | ICD-10-CM

## 2015-12-29 DIAGNOSIS — Z3A21 21 weeks gestation of pregnancy: Secondary | ICD-10-CM | POA: Diagnosis not present

## 2015-12-29 LAB — POCT URINALYSIS DIPSTICK
Blood, UA: NEGATIVE
GLUCOSE UA: NEGATIVE
Glucose, UA: NEGATIVE
Ketones, UA: NEGATIVE
Ketones, UA: NEGATIVE
LEUKOCYTES UA: NEGATIVE
Leukocytes, UA: NEGATIVE
NITRITE UA: NEGATIVE
Nitrite, UA: NEGATIVE
Protein, UA: NEGATIVE
RBC UA: NEGATIVE

## 2015-12-29 NOTE — Progress Notes (Signed)
Patient ID: Regina Davis, female   DOB: 07/21/1985, 30 y.o.   MRN: [id number]  [id number] 5429w3d Estimated Date of Delivery: 05/14/16 LROB  Patient reports good fetal movement, denies any bleeding and no rupture of membranes symptoms or regular contractions. Patient complaints: none. Pt states she is not yet sure what method of birth control she wants to use after delivery.   Blood pressure 110/76, pulse 80, weight 226 lb (102.513 kg), last menstrual period 08/17/2015.  refer to the ob flow sheet for FH and FHR, also BP, Wt, Urine results:notable for trace protein                           Physical Examination: General appearance - alert, well appearing, and in no distress                                      Abdomen  -FHR 156 bpm                                                          soft, nontender, nondistended, no masses or organomegaly  Discussed with pt LARCs after delivery. At end of discussion, pt had opportunity to ask questions and has no further questions at this time.   Greater than 50% was spent in counseling and coordination of care with the patient. Total time greater than: 15 minutes                                                             Questions were answered. Assessment: LROB G3P2002 @ 2629w3d  NT today normal   Plan:  Continued routine obstetrical care  F/u in 4 weeks for routine prenatal care    By signing my name below, I, Doreatha MartinEva Mathews, attest that this documentation has been prepared under the direction and in the presence of Tilda BurrowJohn V Kasean Denherder, MD. Electronically Signed: Doreatha MartinEva Mathews, ED Scribe. 12/29/2015. 9:37 AM.  I personally performed the services described in this documentation, which was SCRIBED in my presence. The recorded information has been reviewed and considered accurate. It has been edited as necessary during review. Tilda BurrowFERGUSON,Calynn Ferrero V, MD

## 2015-12-29 NOTE — Progress Notes (Signed)
US 20+3 wks,breech,cx 4.2 cm,post pl gr 0,normal ov's bilat,fhr 156 bpm,svp of fluid 4 cm,efw 384 g,anatomy complete no obvious abnormalities seen

## 2015-12-29 NOTE — Progress Notes (Signed)
Pt states that she has noticed pain that comes and goes on both sides.

## 2016-01-17 ENCOUNTER — Telehealth: Payer: Self-pay | Admitting: *Deleted

## 2016-01-17 NOTE — Telephone Encounter (Signed)
Pt c/o vaginal irritation, white/creamy thick discharge. Pt states unable to come in for an appt today. Pt given an appt tomorrow for evaluation.

## 2016-01-18 ENCOUNTER — Encounter: Payer: Self-pay | Admitting: Obstetrics and Gynecology

## 2016-01-18 ENCOUNTER — Ambulatory Visit (INDEPENDENT_AMBULATORY_CARE_PROVIDER_SITE_OTHER): Payer: Medicaid Other | Admitting: Obstetrics and Gynecology

## 2016-01-18 VITALS — BP 120/80 | HR 96 | Wt 227.0 lb

## 2016-01-18 DIAGNOSIS — Z3482 Encounter for supervision of other normal pregnancy, second trimester: Secondary | ICD-10-CM

## 2016-01-18 DIAGNOSIS — Z3A24 24 weeks gestation of pregnancy: Secondary | ICD-10-CM

## 2016-01-18 DIAGNOSIS — N760 Acute vaginitis: Secondary | ICD-10-CM | POA: Diagnosis not present

## 2016-01-18 DIAGNOSIS — O2392 Unspecified genitourinary tract infection in pregnancy, second trimester: Secondary | ICD-10-CM

## 2016-01-18 DIAGNOSIS — A499 Bacterial infection, unspecified: Secondary | ICD-10-CM | POA: Diagnosis not present

## 2016-01-18 DIAGNOSIS — Z331 Pregnant state, incidental: Secondary | ICD-10-CM

## 2016-01-18 DIAGNOSIS — Z1389 Encounter for screening for other disorder: Secondary | ICD-10-CM

## 2016-01-18 DIAGNOSIS — B9689 Other specified bacterial agents as the cause of diseases classified elsewhere: Secondary | ICD-10-CM

## 2016-01-18 DIAGNOSIS — N9089 Other specified noninflammatory disorders of vulva and perineum: Secondary | ICD-10-CM | POA: Insufficient documentation

## 2016-01-18 LAB — POCT URINALYSIS DIPSTICK
Glucose, UA: NEGATIVE
KETONES UA: NEGATIVE
Leukocytes, UA: NEGATIVE
Nitrite, UA: NEGATIVE
PROTEIN UA: NEGATIVE
RBC UA: NEGATIVE

## 2016-01-18 MED ORDER — METRONIDAZOLE 500 MG PO TABS: 500.0000 mg | ORAL_TABLET | Freq: Two times a day (BID) | ORAL | 0 refills | Status: DC

## 2016-01-18 NOTE — Progress Notes (Signed)
Pt worked in today for vaginal irritation and has a white discharge.

## 2016-01-18 NOTE — Progress Notes (Signed)
Patient ID: Regina Davis, female   DOB: 10/30/1985, 30 y.o.   MRN: [id number]  [id number]  Estimated Date of Delivery: 05/14/16 LROB 6743w2d  Blood pressure 120/80, pulse 96, weight 227 lb (103 kg), last menstrual period 08/17/2015.    Urine results: notable for none  Chief Complaint  Patient presents with  . vaginal irritation    vaginal irritation and discharge    Patient complaints: She complains of some vaginal and vulvar irritation this week with associated vaginal discharge at the vaginal entrance and the sensation of vulvar swelling. She denies dysuria, itching. Pt states she has not experienced similar issues with prior pregnancies. No h/o yeast or DM.   Patient reports good fetal movement. She denies any bleeding, rupture of membranes, or regular contractions.   Refer to the ob flow sheet for FH and FHR.    Physical Examination: General appearance - alert, well appearing, and in no distress                                      Abdomen - FH measures well above navel,                                                         -FHR 159 bpm                                                        -soft, nontender, nondistended, no masses or organomegaly                                      Pelvic - VULVA: normal appearing vulva with no masses, tenderness or lesions,      VAGINA: normal appearing vagina with normal color and normal appearing discharge, no lesions,      CERVIX: normal appearing cervix without discharge or lesions                                            Questions were answered. Assessment: LROB G3P2002 @ 343w2d Vaginal and vulvar irritation, sensation of vulvar swelling   Normal appearing secretions on exam  Wet prep and KOH collected / no yeast, RBC or tric, moderate clue cells, moderate white cells,   +BV  Plan:   Continued routine obstetrical care Will rx Metronidazole  Consider 1% Hydrocortisone for swelling and additional discomfort   F/u in 4 weeks for routine  prenatal care   By signing my name below, I, Doreatha MartinEva Mathews, attest that this documentation has been prepared under the direction and in the presence of Tilda BurrowJohn V Sami Roes, MD. Electronically Signed: Doreatha MartinEva Mathews, ED Scribe. 01/18/16. 1:59 PM. I personally performed the services described in this documentation, which was SCRIBED in my presence. The recorded information has been reviewed and considered accurate. It has been edited as necessary during review. Tilda BurrowFERGUSON,Angela Platner V, MD

## 2016-01-26 ENCOUNTER — Encounter: Payer: Medicaid Other | Admitting: Advanced Practice Midwife

## 2016-02-02 ENCOUNTER — Ambulatory Visit (INDEPENDENT_AMBULATORY_CARE_PROVIDER_SITE_OTHER): Payer: Medicaid Other | Admitting: Advanced Practice Midwife

## 2016-02-02 ENCOUNTER — Encounter: Payer: Self-pay | Admitting: Advanced Practice Midwife

## 2016-02-02 VITALS — BP 118/64 | HR 88 | Wt 227.0 lb

## 2016-02-02 DIAGNOSIS — Z1389 Encounter for screening for other disorder: Secondary | ICD-10-CM

## 2016-02-02 DIAGNOSIS — Z369 Encounter for antenatal screening, unspecified: Secondary | ICD-10-CM

## 2016-02-02 DIAGNOSIS — Z3492 Encounter for supervision of normal pregnancy, unspecified, second trimester: Secondary | ICD-10-CM

## 2016-02-02 DIAGNOSIS — Z3482 Encounter for supervision of other normal pregnancy, second trimester: Secondary | ICD-10-CM

## 2016-02-02 DIAGNOSIS — Z331 Pregnant state, incidental: Secondary | ICD-10-CM

## 2016-02-02 DIAGNOSIS — Z3A26 26 weeks gestation of pregnancy: Secondary | ICD-10-CM

## 2016-02-02 LAB — POCT URINALYSIS DIPSTICK
Blood, UA: NEGATIVE
GLUCOSE UA: NEGATIVE
KETONES UA: NEGATIVE
LEUKOCYTES UA: NEGATIVE
NITRITE UA: NEGATIVE
Protein, UA: NEGATIVE

## 2016-02-02 NOTE — Patient Instructions (Signed)

## 2016-02-02 NOTE — Progress Notes (Signed)
[id number] 6127w3d Estimated Date of Delivery: 05/14/16  Blood pressure 118/64, pulse 88, weight 227 lb (103 kg), last menstrual period 08/17/2015.   BP weight and urine results all reviewed and noted.  Please refer to the obstetrical flow sheet for the fundal height and fetal heart rate documentation:  Patient reports good fetal movement, denies any bleeding and no rupture of membranes symptoms or regular contractions. Patient c/o "irritation" in creases of groin. Finished Flagyl for BV.  Rash looks like it could be yeast; vagina normal.  All questions were answered.  Orders Placed This Encounter  Procedures  . informaSeq(SM) Prenatal Test  . POCT urinalysis dipstick    Plan:  Continued routine obstetrical care, OTC monistat BID to rash  Return in about 2 weeks (around 02/16/2016) for PN2/LROB.    \

## 2016-02-07 LAB — INFORMASEQ(SM) PRENATAL TEST
Fetal Fraction (%):: 17.8
Gestational Age at Collection: 24.7 weeks

## 2016-02-17 ENCOUNTER — Other Ambulatory Visit: Payer: Medicaid Other

## 2016-02-17 ENCOUNTER — Encounter: Payer: Medicaid Other | Admitting: Advanced Practice Midwife

## 2016-02-23 ENCOUNTER — Ambulatory Visit (INDEPENDENT_AMBULATORY_CARE_PROVIDER_SITE_OTHER): Payer: Medicaid Other | Admitting: Women's Health

## 2016-02-23 ENCOUNTER — Encounter: Payer: Self-pay | Admitting: Women's Health

## 2016-02-23 VITALS — BP 102/56 | HR 104 | Wt 228.0 lb

## 2016-02-23 DIAGNOSIS — Z1389 Encounter for screening for other disorder: Secondary | ICD-10-CM

## 2016-02-23 DIAGNOSIS — Z331 Pregnant state, incidental: Secondary | ICD-10-CM

## 2016-02-23 DIAGNOSIS — Z3493 Encounter for supervision of normal pregnancy, unspecified, third trimester: Secondary | ICD-10-CM

## 2016-02-23 LAB — POCT URINALYSIS DIPSTICK
Blood, UA: NEGATIVE
Glucose, UA: NEGATIVE
Leukocytes, UA: NEGATIVE
Nitrite, UA: NEGATIVE

## 2016-02-23 NOTE — Progress Notes (Signed)
Low-risk OB appointment [id number] 2543w3d Estimated Date of Delivery: 05/14/16 BP (!) 102/56   Pulse (!) 104   Wt 228 lb (103.4 kg)   LMP 08/17/2015 (Approximate)   BMI 35.18 kg/m   BP, weight, and urine reviewed.  Refer to obstetrical flow sheet for FH & FHR.  Reports good fm.  Denies regular uc's, lof, vb, or uti s/s. No complaints. Had to reschedule last visit and didn't get rescheduled for PN2.  Reviewed ptl s/s, fkc. Recommended Tdap at HD/PCP per CDC guidelines.  Plan:  Continue routine obstetrical care  F/U this week for pn2 (no visit), then 4wks for OB appointment

## 2016-02-23 NOTE — Patient Instructions (Addendum)
You will have your sugar test next visit.  Please do not eat or drink anything after midnight the night before you come, not even water.  You will be here for at least two hours.     Call the office ([phone]) or go to [facility] if:  You begin to have strong, frequent contractions  Your water breaks.  Sometimes it is a big gush of fluid, sometimes it is just a trickle that keeps getting your panties wet or running down your legs  You have vaginal bleeding.  It is normal to have a small amount of spotting if your cervix was checked.   You don't feel your baby moving like normal.  If you don't, get you something to eat and drink and lay down and focus on feeling your baby move.  You should feel at least 10 movements in 2 hours.  If you don't, you should call the office or go to [facility].    Tdap Vaccine  It is recommended that you get the Tdap vaccine during the third trimester of EACH pregnancy to help protect your baby from getting pertussis (whooping cough)  27-36 weeks is the BEST time to do this so that you can pass the protection on to your baby. During pregnancy is better than after pregnancy, but if you are unable to get it during pregnancy it will be offered at the hospital.   You can get this vaccine at the health department or your family doctor  Everyone who will be around your baby should also be up-to-date on their vaccines. Adults (who are not pregnant) only need 1 dose of Tdap during adulthood.   Third Trimester of Pregnancy The third trimester is from week 29 through week 42, months 7 through 9. The third trimester is a time when the fetus is growing rapidly. At the end of the ninth month, the fetus is about 20 inches in length and weighs 6-10 pounds.  BODY CHANGES Your body goes through many changes during pregnancy. The changes vary from woman to woman.   Your weight will continue to increase. You can expect to gain 25-35 pounds (11-16 kg) by the end of the  pregnancy.  You may begin to get stretch marks on your hips, abdomen, and breasts.  You may urinate more often because the fetus is moving lower into your pelvis and pressing on your bladder.  You may develop or continue to have heartburn as a result of your pregnancy.  You may develop constipation because certain hormones are causing the muscles that push waste through your intestines to slow down.  You may develop hemorrhoids or swollen, bulging veins (varicose veins).  You may have pelvic pain because of the weight gain and pregnancy hormones relaxing your joints between the bones in your pelvis. Backaches may result from overexertion of the muscles supporting your posture.  You may have changes in your hair. These can include thickening of your hair, rapid growth, and changes in texture. Some women also have hair loss during or after pregnancy, or hair that feels dry or thin. Your hair will most likely return to normal after your baby is born.  Your breasts will continue to grow and be tender. A yellow discharge may leak from your breasts called colostrum.  Your belly button may stick out.  You may feel short of breath because of your expanding uterus.  You may notice the fetus "dropping," or moving lower in your abdomen.  You may have   a bloody mucus discharge. This usually occurs a few days to a week before labor begins.  Your cervix becomes thin and soft (effaced) near your due date. WHAT TO EXPECT AT YOUR PRENATAL EXAMS  You will have prenatal exams every 2 weeks until week 36. Then, you will have weekly prenatal exams. During a routine prenatal visit:  You will be weighed to make sure you and the fetus are growing normally.  Your blood pressure is taken.  Your abdomen will be measured to track your baby's growth.  The fetal heartbeat will be listened to.  Any test results from the previous visit will be discussed.  You may have a cervical check near your due date to  see if you have effaced. At around 36 weeks, your caregiver will check your cervix. At the same time, your caregiver will also perform a test on the secretions of the vaginal tissue. This test is to determine if a type of bacteria, Group B streptococcus, is present. Your caregiver will explain this further. Your caregiver may ask you:  What your birth plan is.  How you are feeling.  If you are feeling the baby move.  If you have had any abnormal symptoms, such as leaking fluid, bleeding, severe headaches, or abdominal cramping.  If you have any questions. Other tests or screenings that may be performed during your third trimester include:  Blood tests that check for low iron levels (anemia).  Fetal testing to check the health, activity level, and growth of the fetus. Testing is done if you have certain medical conditions or if there are problems during the pregnancy. FALSE LABOR You may feel small, irregular contractions that eventually go away. These are called Braxton Hicks contractions, or false labor. Contractions may last for hours, days, or even weeks before true labor sets in. If contractions come at regular intervals, intensify, or become painful, it is best to be seen by your caregiver.  SIGNS OF LABOR   Menstrual-like cramps.  Contractions that are 5 minutes apart or less.  Contractions that start on the top of the uterus and spread down to the lower abdomen and back.  A sense of increased pelvic pressure or back pain.  A watery or bloody mucus discharge that comes from the vagina. If you have any of these signs before the 37th week of pregnancy, call your caregiver right away. You need to go to the hospital to get checked immediately. HOME CARE INSTRUCTIONS   Avoid all smoking, herbs, alcohol, and unprescribed drugs. These chemicals affect the formation and growth of the baby.  Follow your caregiver's instructions regarding medicine use. There are medicines that are  either safe or unsafe to take during pregnancy.  Exercise only as directed by your caregiver. Experiencing uterine cramps is a good sign to stop exercising.  Continue to eat regular, healthy meals.  Wear a good support bra for breast tenderness.  Do not use hot tubs, steam rooms, or saunas.  Wear your seat belt at all times when driving.  Avoid raw meat, uncooked cheese, cat litter boxes, and soil used by cats. These carry germs that can cause birth defects in the baby.  Take your prenatal vitamins.  Try taking a stool softener (if your caregiver approves) if you develop constipation. Eat more high-fiber foods, such as fresh vegetables or fruit and whole grains. Drink plenty of fluids to keep your urine clear or pale yellow.  Take warm sitz baths to soothe any pain or discomfort caused   by hemorrhoids. Use hemorrhoid cream if your caregiver approves.  If you develop varicose veins, wear support hose. Elevate your feet for 15 minutes, 3-4 times a day. Limit salt in your diet.  Avoid heavy lifting, wear low heal shoes, and practice good posture.  Rest a lot with your legs elevated if you have leg cramps or low back pain.  Visit your dentist if you have not gone during your pregnancy. Use a soft toothbrush to brush your teeth and be gentle when you floss.  A sexual relationship may be continued unless your caregiver directs you otherwise.  Do not travel far distances unless it is absolutely necessary and only with the approval of your caregiver.  Take prenatal classes to understand, practice, and ask questions about the labor and delivery.  Make a trial run to the hospital.  Pack your hospital bag.  Prepare the baby's nursery.  Continue to go to all your prenatal visits as directed by your caregiver. SEEK MEDICAL CARE IF:  You are unsure if you are in labor or if your water has broken.  You have dizziness.  You have mild pelvic cramps, pelvic pressure, or nagging pain in  your abdominal area.  You have persistent nausea, vomiting, or diarrhea.  You have a bad smelling vaginal discharge.  You have pain with urination. SEEK IMMEDIATE MEDICAL CARE IF:   You have a fever.  You are leaking fluid from your vagina.  You have spotting or bleeding from your vagina.  You have severe abdominal cramping or pain.  You have rapid weight loss or gain.  You have shortness of breath with chest pain.  You notice sudden or extreme swelling of your face, hands, ankles, feet, or legs.  You have not felt your baby move in over an hour.  You have severe headaches that do not go away with medicine.  You have vision changes. Document Released: 05/23/2001 Document Revised: 06/03/2013 Document Reviewed: 07/30/2012 Exit[employer] Patient Information 2015 [employer]. This information is not intended to replace advice given to you by your health care provider. Make sure you discuss any questions you have with your health care provider.   

## 2016-02-25 ENCOUNTER — Other Ambulatory Visit: Payer: Medicaid Other

## 2016-02-28 ENCOUNTER — Other Ambulatory Visit: Payer: Medicaid Other

## 2016-02-29 ENCOUNTER — Other Ambulatory Visit: Payer: Medicaid Other

## 2016-03-01 ENCOUNTER — Other Ambulatory Visit: Payer: Self-pay | Admitting: Women's Health

## 2016-03-01 ENCOUNTER — Telehealth: Payer: Self-pay | Admitting: *Deleted

## 2016-03-01 LAB — RPR: RPR: NONREACTIVE

## 2016-03-01 LAB — HIV ANTIBODY (ROUTINE TESTING W REFLEX): HIV SCREEN 4TH GENERATION: NONREACTIVE

## 2016-03-01 LAB — CBC
HEMATOCRIT: 32.2 % — AB (ref 34.0–46.6)
Hemoglobin: 10.5 g/dL — ABNORMAL LOW (ref 11.1–15.9)
MCH: 28.4 pg (ref 26.6–33.0)
MCHC: 32.6 g/dL (ref 31.5–35.7)
MCV: 87 fL (ref 79–97)
PLATELETS: 263 10*3/uL (ref 150–379)
RBC: 3.7 x10E6/uL — AB (ref 3.77–5.28)
RDW: 14.2 % (ref 12.3–15.4)
WBC: 7.2 10*3/uL (ref 3.4–10.8)

## 2016-03-01 LAB — GLUCOSE TOLERANCE, 2 HOURS W/ 1HR
Glucose, 1 hour: 58 mg/dL — ABNORMAL LOW (ref 65–179)
Glucose, 2 hour: 70 mg/dL (ref 65–152)
Glucose, Fasting: 85 mg/dL (ref 65–91)

## 2016-03-01 LAB — ANTIBODY SCREEN: Antibody Screen: NEGATIVE

## 2016-03-01 MED ORDER — FERROUS SULFATE 325 (65 FE) MG PO TABS: 325.0000 mg | ORAL_TABLET | Freq: Two times a day (BID) | ORAL | 3 refills | Status: DC

## 2016-03-01 NOTE — Telephone Encounter (Signed)
Pt informed per Joellyn HaffKim Booker, CNM, Dx of anemia, Fe supplement e-scribed, continue PNV daily, increase iron rich foods (red meat, beans, green leafy veg). Pt verbalized understanding.

## 2016-03-06 ENCOUNTER — Telehealth: Payer: Self-pay | Admitting: *Deleted

## 2016-03-06 NOTE — Telephone Encounter (Signed)
Pt states she was returning a call to our office. Pt informed did not see any documentation were anyone from our office had called. Pt was informed on 03/01/2016 of DX of anemia, states she is taking the Fe, PNV daily, increased Fe rich foods.

## 2016-03-15 ENCOUNTER — Encounter (HOSPITAL_COMMUNITY): Payer: Self-pay

## 2016-03-22 ENCOUNTER — Ambulatory Visit (INDEPENDENT_AMBULATORY_CARE_PROVIDER_SITE_OTHER): Payer: Medicaid Other | Admitting: Advanced Practice Midwife

## 2016-03-22 ENCOUNTER — Encounter: Payer: Self-pay | Admitting: Advanced Practice Midwife

## 2016-03-22 VITALS — BP 102/60 | HR 80 | Wt 228.0 lb

## 2016-03-22 DIAGNOSIS — Z3483 Encounter for supervision of other normal pregnancy, third trimester: Secondary | ICD-10-CM

## 2016-03-22 DIAGNOSIS — Z331 Pregnant state, incidental: Secondary | ICD-10-CM

## 2016-03-22 DIAGNOSIS — Z1389 Encounter for screening for other disorder: Secondary | ICD-10-CM

## 2016-03-22 LAB — POCT URINALYSIS DIPSTICK
Blood, UA: NEGATIVE
Glucose, UA: NEGATIVE
Ketones, UA: NEGATIVE
Leukocytes, UA: NEGATIVE
Nitrite, UA: NEGATIVE

## 2016-03-22 NOTE — Patient Instructions (Signed)

## 2016-03-22 NOTE — Progress Notes (Signed)
[id number] 4168w3d Estimated Date of Delivery: 05/14/16  Blood pressure 102/60, pulse 80, weight 228 lb (103.4 kg), last menstrual period 08/17/2015.   BP weight and urine results all reviewed and noted.  Please refer to the obstetrical flow sheet for the fundal height and fetal heart rate documentation:  Patient reports good fetal movement, denies any bleeding and no rupture of membranes symptoms or regular contractions. Patient is without complaints. All questions were answered.  Orders Placed This Encounter  Procedures  . POCT urinalysis dipstick    Plan:  Continued routine obstetrical care,   Return in about 2 weeks (around 04/05/2016) for LROB.

## 2016-04-05 ENCOUNTER — Encounter: Payer: Self-pay | Admitting: Advanced Practice Midwife

## 2016-04-21 ENCOUNTER — Ambulatory Visit (INDEPENDENT_AMBULATORY_CARE_PROVIDER_SITE_OTHER): Payer: Medicaid Other | Admitting: Women's Health

## 2016-04-21 ENCOUNTER — Encounter: Payer: Self-pay | Admitting: Women's Health

## 2016-04-21 VITALS — BP 110/60 | HR 72 | Wt 233.0 lb

## 2016-04-21 DIAGNOSIS — Z118 Encounter for screening for other infectious and parasitic diseases: Secondary | ICD-10-CM

## 2016-04-21 DIAGNOSIS — Z1389 Encounter for screening for other disorder: Secondary | ICD-10-CM

## 2016-04-21 DIAGNOSIS — Z3483 Encounter for supervision of other normal pregnancy, third trimester: Secondary | ICD-10-CM

## 2016-04-21 DIAGNOSIS — Z331 Pregnant state, incidental: Secondary | ICD-10-CM

## 2016-04-21 DIAGNOSIS — Z1159 Encounter for screening for other viral diseases: Secondary | ICD-10-CM

## 2016-04-21 DIAGNOSIS — Z3A37 37 weeks gestation of pregnancy: Secondary | ICD-10-CM

## 2016-04-21 DIAGNOSIS — Z3685 Encounter for antenatal screening for Streptococcus B: Secondary | ICD-10-CM

## 2016-04-21 LAB — POCT URINALYSIS DIPSTICK
Glucose, UA: NEGATIVE
KETONES UA: NEGATIVE
Leukocytes, UA: NEGATIVE
Nitrite, UA: NEGATIVE
PROTEIN UA: NEGATIVE
RBC UA: NEGATIVE

## 2016-04-21 NOTE — Patient Instructions (Signed)
Call the office ([phone]) or go to [facility] if:  You begin to have strong, frequent contractions  Your water breaks.  Sometimes it is a big gush of fluid, sometimes it is just a trickle that keeps getting your panties wet or running down your legs  You have vaginal bleeding.  It is normal to have a small amount of spotting if your cervix was checked.   You don't feel your baby moving like normal.  If you don't, get you something to eat and drink and lay down and focus on feeling your baby move.  You should feel at least 10 movements in 2 hours.  If you don't, you should call the office or go to [facility].    Braxton Hicks Contractions Contractions of the uterus can occur throughout pregnancy. Contractions are not always a sign that you are in labor.  WHAT ARE BRAXTON HICKS CONTRACTIONS?  Contractions that occur before labor are called Braxton Hicks contractions, or false labor. Toward the end of pregnancy (32-34 weeks), these contractions can develop more often and may become more forceful. This is not true labor because these contractions do not result in opening (dilatation) and thinning of the cervix. They are sometimes difficult to tell apart from true labor because these contractions can be forceful and people have different pain tolerances. You should not feel embarrassed if you go to the hospital with false labor. Sometimes, the only way to tell if you are in true labor is for your health care provider to look for changes in the cervix. If there are no prenatal problems or other health problems associated with the pregnancy, it is completely safe to be sent home with false labor and await the onset of true labor. HOW CAN YOU TELL THE DIFFERENCE BETWEEN TRUE AND FALSE LABOR? False Labor  The contractions of false labor are usually shorter and not as hard as those of true labor.   The contractions are usually irregular.   The contractions are often felt in the front of  the lower abdomen and in the groin.   The contractions may go away when you walk around or change positions while lying down.   The contractions get weaker and are shorter lasting as time goes on.   The contractions do not usually become progressively stronger, regular, and closer together as with true labor.  True Labor  Contractions in true labor last 30-70 seconds, become very regular, usually become more intense, and increase in frequency.   The contractions do not go away with walking.   The discomfort is usually felt in the top of the uterus and spreads to the lower abdomen and low back.   True labor can be determined by your health care provider with an exam. This will show that the cervix is dilating and getting thinner.  WHAT TO REMEMBER  Keep up with your usual exercises and follow other instructions given by your health care provider.   Take medicines as directed by your health care provider.   Keep your regular prenatal appointments.   Eat and drink lightly if you think you are going into labor.   If Braxton Hicks contractions are making you uncomfortable:   Change your position from lying down or resting to walking, or from walking to resting.   Sit and rest in a tub of warm water.   Drink 2-3 glasses of water. Dehydration may cause these contractions.   Do slow and deep breathing several times an hour.    WHEN SHOULD I SEEK IMMEDIATE MEDICAL CARE? Seek immediate medical care if:  Your contractions become stronger, more regular, and closer together.   You have fluid leaking or gushing from your vagina.   You have a fever.   You pass blood-tinged mucus.   You have vaginal bleeding.   You have continuous abdominal pain.   You have low back pain that you never had before.   You feel your baby's head pushing down and causing pelvic pressure.   Your baby is not moving as much as it used to.    This information is not intended to  replace advice given to you by your health care provider. Make sure you discuss any questions you have with your health care provider.   Document Released: 05/29/2005 Document Revised: 06/03/2013 Document Reviewed: 03/10/2013 Elsevier Interactive Patient Education 2016 [employer]  

## 2016-04-21 NOTE — Progress Notes (Signed)
Low-risk OB appointment [id number] 3224w5d Estimated Date of Delivery: 05/14/16 BP 110/60   Pulse 72   Wt 233 lb (105.7 kg)   LMP 08/17/2015 (Approximate)   BMI 35.95 kg/m   BP, weight, and urine reviewed.  Refer to obstetrical flow sheet for FH & FHR.  Reports good fm.  Denies regular uc's, lof, vb, or uti s/s. No complaints. GBS, gc/ct collected SVE per request: ft/th/ballotable, vtx confirmed by informal u/s Reviewed ptl s/s, fkc. Plan:  Continue routine obstetrical care  F/U in 1wk for OB appointment

## 2016-04-23 LAB — STREP GP B NAA: STREP GROUP B AG: POSITIVE — AB

## 2016-04-25 LAB — GC/CHLAMYDIA PROBE AMP
CHLAMYDIA, DNA PROBE: NEGATIVE
Neisseria gonorrhoeae by PCR: NEGATIVE

## 2016-04-28 ENCOUNTER — Encounter: Payer: Self-pay | Admitting: Obstetrics and Gynecology

## 2016-05-08 ENCOUNTER — Ambulatory Visit (INDEPENDENT_AMBULATORY_CARE_PROVIDER_SITE_OTHER): Payer: Medicaid Other | Admitting: Obstetrics and Gynecology

## 2016-05-08 ENCOUNTER — Encounter: Payer: Self-pay | Admitting: Obstetrics and Gynecology

## 2016-05-08 VITALS — BP 122/68 | HR 84 | Wt 231.0 lb

## 2016-05-08 DIAGNOSIS — Z3A39 39 weeks gestation of pregnancy: Secondary | ICD-10-CM

## 2016-05-08 DIAGNOSIS — Z1389 Encounter for screening for other disorder: Secondary | ICD-10-CM

## 2016-05-08 DIAGNOSIS — Z331 Pregnant state, incidental: Secondary | ICD-10-CM

## 2016-05-08 DIAGNOSIS — Z3483 Encounter for supervision of other normal pregnancy, third trimester: Secondary | ICD-10-CM

## 2016-05-08 NOTE — Progress Notes (Signed)
[id number]  Estimated Date of Delivery: 05/14/16 LROB 6244w1d  Blood pressure 122/68, pulse 84, weight 231 lb (104.8 kg), last menstrual period 08/17/2015.   Urine results:notable for neg protein  Chief Complaint  Patient presents with  . Routine Prenatal Visit    Patient complaints:none at this time. Patient reports   good fetal movement,                           denies any bleeding , rupture of membranes,or regular contractions.   refer to the ob flow sheet for FH and FHR, ,                          Physical Examination: General appearance - alert, well appearing, and in no distress                                      Abdomen - FH 39 ,                                                         -FHR 143                                                         soft, nontender, nondistended, no masses or organomegaly                                      Pelvic - cervix1-2 cm dilated                                            Questions were answered. Assessment: LROB [id number] @ 4644w1d   Discussion:  Discussed with pt options for birth control/sterilization Pt has multiple objections to IUD, nexplanon, and partner uses the Phrase "Mutilation " to describe tubal ligation or Vasectomy.   At end of discussion, pt had opportunity to ask questions and has no further questions at this time.   Specific discussion of options as noted above. Greater than 50% was spent in counseling and coordination of care with the patient.   Total time greater than: 25 minutes.    Plan:  Continued routine obstetrical care,   F/u in 1 week for routine prenatal visit   By signing my name below, I, Sonum Patel, attest that this documentation has been prepared under the direction and in the presence of Tilda BurrowJohn V Mischa Brittingham, MD. Electronically Signed: Sonum Patel, Neurosurgeoncribe. 05/08/16. 2:46 PM.  I personally performed the services described in this documentation, which was SCRIBED in my presence. The recorded information  has been reviewed and considered accurate. It has been edited as necessary during review. Tilda BurrowFERGUSON,Loranda Mastel V, MD

## 2016-05-15 ENCOUNTER — Ambulatory Visit (INDEPENDENT_AMBULATORY_CARE_PROVIDER_SITE_OTHER): Payer: Medicaid Other | Admitting: Obstetrics and Gynecology

## 2016-05-15 ENCOUNTER — Telehealth: Payer: Self-pay | Admitting: *Deleted

## 2016-05-15 DIAGNOSIS — Z3483 Encounter for supervision of other normal pregnancy, third trimester: Secondary | ICD-10-CM | POA: Diagnosis not present

## 2016-05-15 DIAGNOSIS — O480 Post-term pregnancy: Secondary | ICD-10-CM

## 2016-05-15 DIAGNOSIS — Z3A41 41 weeks gestation of pregnancy: Secondary | ICD-10-CM

## 2016-05-15 DIAGNOSIS — Z3480 Encounter for supervision of other normal pregnancy, unspecified trimester: Secondary | ICD-10-CM

## 2016-05-15 LAB — POCT URINALYSIS DIPSTICK
Blood, UA: NEGATIVE
GLUCOSE UA: NEGATIVE
KETONES UA: NEGATIVE
Nitrite, UA: NEGATIVE

## 2016-05-15 NOTE — Progress Notes (Addendum)
[id number]  Estimated Date of Delivery: 05/14/16 LROB 58101w1d  Last menstrual period 08/17/2015.   Urine results:  No chief complaint on file.   Patient complaints: none at this time. Patient reports   good fetal movement,                           denies any bleeding , rupture of membranes,or regular contractions.   refer to the ob flow sheet for FH and FHR, ,                          Physical Examination: General appearance - alert, well appearing, and in no distress                                      Abdomen - FH 41 ,                                                         -FHR 152                                                         soft, nontender, nondistended, no masses or organomegaly                                      Pelvic - cervix is soft and finger tip size                                           Questions were answered.  Assessment: LROB [id number] @ 23101w1d Estimated Date of Delivery: 05/14/16   Scheduled for IOL on Sunday, Dec 10 at 7 am , probable foley bulb then pitocin.  Plan:  Continued routine obstetrical care,   F/u in 5 weeks for post partum visit, and pt now WISHES TO BE CONSIDERED FOR PERMANENT STERILIZATION . PT informed that Medicaid requires tubal consents to have been signed x 30 days to approve procedure.   By signing my name below, I, Sonum Patel, attest that this documentation has been prepared under the direction and in the presence of Tilda BurrowJohn V Yulissa Needham, MD. Electronically Signed: Sonum Patel, Neurosurgeoncribe. 05/15/16. 4:18 PM.  I personally performed the services described in this documentation, which was SCRIBED in my presence. The recorded information has been reviewed and considered accurate. It has been edited as necessary during review. Tilda BurrowFERGUSON,Shabreka Coulon V, MD

## 2016-05-15 NOTE — Telephone Encounter (Signed)
Opened encounter in error  

## 2016-05-16 ENCOUNTER — Telehealth (HOSPITAL_COMMUNITY): Payer: Self-pay | Admitting: *Deleted

## 2016-05-16 NOTE — Telephone Encounter (Signed)
Preadmission screen  

## 2016-05-16 NOTE — Telephone Encounter (Deleted)
Preadmission screen  

## 2016-05-19 LAB — OB RESULTS CONSOLE GBS: GBS: POSITIVE

## 2016-05-21 ENCOUNTER — Inpatient Hospital Stay (HOSPITAL_COMMUNITY): Payer: Medicaid Other | Admitting: Anesthesiology

## 2016-05-21 ENCOUNTER — Inpatient Hospital Stay (HOSPITAL_COMMUNITY)
Admission: RE | Admit: 2016-05-21 | Discharge: 2016-05-21 | Disposition: A | Discharge status: Home / Self Care | Payer: Medicaid Other | Source: Ambulatory Visit | Attending: Obstetrics and Gynecology | Admitting: Obstetrics and Gynecology

## 2016-05-21 ENCOUNTER — Encounter (HOSPITAL_COMMUNITY): Payer: Self-pay | Admitting: *Deleted

## 2016-05-21 ENCOUNTER — Inpatient Hospital Stay (HOSPITAL_COMMUNITY)
Admission: AD | Admit: 2016-05-21 | Discharge: 2016-05-23 | DRG: 775 | Disposition: A | Discharge status: Home / Self Care | Payer: Medicaid Other | Source: Ambulatory Visit | Attending: Obstetrics & Gynecology | Admitting: Obstetrics & Gynecology

## 2016-05-21 DIAGNOSIS — O480 Post-term pregnancy: Principal | ICD-10-CM | POA: Diagnosis present

## 2016-05-21 DIAGNOSIS — O99824 Streptococcus B carrier state complicating childbirth: Secondary | ICD-10-CM | POA: Diagnosis present

## 2016-05-21 DIAGNOSIS — Z3480 Encounter for supervision of other normal pregnancy, unspecified trimester: Secondary | ICD-10-CM

## 2016-05-21 DIAGNOSIS — Z8249 Family history of ischemic heart disease and other diseases of the circulatory system: Secondary | ICD-10-CM

## 2016-05-21 DIAGNOSIS — Z3A41 41 weeks gestation of pregnancy: Secondary | ICD-10-CM

## 2016-05-21 LAB — TYPE AND SCREEN
ABO/RH(D): A POS
ANTIBODY SCREEN: NEGATIVE

## 2016-05-21 LAB — CBC
HCT: 33.9 % — ABNORMAL LOW (ref 36.0–46.0)
Hemoglobin: 11.3 g/dL — ABNORMAL LOW (ref 12.0–15.0)
MCH: 28.8 pg (ref 26.0–34.0)
MCHC: 33.3 g/dL (ref 30.0–36.0)
MCV: 86.3 fL (ref 78.0–100.0)
Platelets: 251 10*3/uL (ref 150–400)
RBC: 3.93 MIL/uL (ref 3.87–5.11)
RDW: 14 % (ref 11.5–15.5)
WBC: 10.6 10*3/uL — ABNORMAL HIGH (ref 4.0–10.5)

## 2016-05-21 LAB — ABO/RH: ABO/RH(D): A POS

## 2016-05-21 MED ORDER — EPHEDRINE 5 MG/ML INJ
10.0000 mg | INTRAVENOUS | Status: DC | PRN
  Filled 2016-05-21: qty 4

## 2016-05-21 MED ORDER — LACTATED RINGERS IV SOLN: 500.0000 mL | INTRAVENOUS | Status: DC | PRN

## 2016-05-21 MED ORDER — DIPHENHYDRAMINE HCL 50 MG/ML IJ SOLN: 12.5000 mg | INTRAMUSCULAR | Status: DC | PRN

## 2016-05-21 MED ORDER — OXYTOCIN 40 UNITS IN LACTATED RINGERS INFUSION - SIMPLE MED
1.0000 m[IU]/min | INTRAVENOUS | Status: DC
  Administered 2016-05-21: 8 m[IU]/min via INTRAVENOUS
  Administered 2016-05-21: 2 m[IU]/min via INTRAVENOUS

## 2016-05-21 MED ORDER — LACTATED RINGERS IV SOLN: 500.0000 mL | Freq: Once | INTRAVENOUS | Status: DC

## 2016-05-21 MED ORDER — OXYCODONE-ACETAMINOPHEN 5-325 MG PO TABS: 1.0000 | ORAL_TABLET | ORAL | Status: DC | PRN

## 2016-05-21 MED ORDER — LACTATED RINGERS IV SOLN
INTRAVENOUS | Status: DC
  Administered 2016-05-21 (×3): via INTRAVENOUS

## 2016-05-21 MED ORDER — OXYTOCIN 40 UNITS IN LACTATED RINGERS INFUSION - SIMPLE MED
2.5000 [IU]/h | INTRAVENOUS | Status: DC
  Filled 2016-05-21 (×2): qty 1000

## 2016-05-21 MED ORDER — OXYTOCIN BOLUS FROM INFUSION
500.0000 mL | Freq: Once | INTRAVENOUS | Status: AC
  Administered 2016-05-22: 500 mL via INTRAVENOUS

## 2016-05-21 MED ORDER — ACETAMINOPHEN 325 MG PO TABS: 650.0000 mg | ORAL_TABLET | ORAL | Status: DC | PRN

## 2016-05-21 MED ORDER — FENTANYL 2.5 MCG/ML BUPIVACAINE 1/10 % EPIDURAL INFUSION (WH - ANES)
14.0000 mL/h | INTRAMUSCULAR | Status: DC | PRN
  Administered 2016-05-21 (×2): 14 mL/h via EPIDURAL
  Filled 2016-05-21 (×2): qty 100

## 2016-05-21 MED ORDER — TERBUTALINE SULFATE 1 MG/ML IJ SOLN
0.2500 mg | Freq: Once | INTRAMUSCULAR | Status: DC | PRN
  Filled 2016-05-21: qty 1

## 2016-05-21 MED ORDER — OXYCODONE-ACETAMINOPHEN 5-325 MG PO TABS: 2.0000 | ORAL_TABLET | ORAL | Status: DC | PRN

## 2016-05-21 MED ORDER — SOD CITRATE-CITRIC ACID 500-334 MG/5ML PO SOLN: 30.0000 mL | ORAL | Status: DC | PRN

## 2016-05-21 MED ORDER — LACTATED RINGERS IV SOLN
500.0000 mL | Freq: Once | INTRAVENOUS | Status: AC
  Administered 2016-05-21: 500 mL via INTRAVENOUS

## 2016-05-21 MED ORDER — ONDANSETRON HCL 4 MG/2ML IJ SOLN: 4.0000 mg | Freq: Four times a day (QID) | INTRAMUSCULAR | Status: DC | PRN

## 2016-05-21 MED ORDER — PHENYLEPHRINE 40 MCG/ML (10ML) SYRINGE FOR IV PUSH (FOR BLOOD PRESSURE SUPPORT)
80.0000 ug | PREFILLED_SYRINGE | INTRAVENOUS | Status: DC | PRN
  Filled 2016-05-21: qty 5

## 2016-05-21 MED ORDER — PENICILLIN G POTASSIUM 5000000 UNITS IJ SOLR
5.0000 10*6.[IU] | Freq: Once | INTRAMUSCULAR | Status: AC
Start: 2016-05-21 — End: 2016-05-21
  Administered 2016-05-21: 5 10*6.[IU] via INTRAVENOUS
  Filled 2016-05-21: qty 5

## 2016-05-21 MED ORDER — LIDOCAINE HCL (PF) 1 % IJ SOLN
INTRAMUSCULAR | Status: DC | PRN
  Administered 2016-05-21: 3 mL via EPIDURAL
  Administered 2016-05-21: 5 mL via EPIDURAL
  Administered 2016-05-21: 2 mL via EPIDURAL

## 2016-05-21 MED ORDER — PHENYLEPHRINE 40 MCG/ML (10ML) SYRINGE FOR IV PUSH (FOR BLOOD PRESSURE SUPPORT)
80.0000 ug | PREFILLED_SYRINGE | INTRAVENOUS | Status: DC | PRN
  Filled 2016-05-21: qty 5
  Filled 2016-05-21: qty 10

## 2016-05-21 MED ORDER — PENICILLIN G POT IN DEXTROSE 60000 UNIT/ML IV SOLN
3.0000 10*6.[IU] | INTRAVENOUS | Status: DC
  Administered 2016-05-21 (×2): 3 10*6.[IU] via INTRAVENOUS
  Filled 2016-05-21 (×6): qty 50

## 2016-05-21 MED ORDER — LIDOCAINE HCL (PF) 1 % IJ SOLN
30.0000 mL | INTRAMUSCULAR | Status: DC | PRN
  Filled 2016-05-21 (×2): qty 30

## 2016-05-21 MED ORDER — SODIUM BICARBONATE 8.4 % IV SOLN
INTRAVENOUS | Status: DC | PRN
  Administered 2016-05-21: 5 mL via EPIDURAL

## 2016-05-21 MED ORDER — PENICILLIN G POTASSIUM 5000000 UNITS IJ SOLR
2.5000 10*6.[IU] | INTRAVENOUS | Status: DC
  Filled 2016-05-21 (×3): qty 2.5

## 2016-05-21 NOTE — Progress Notes (Signed)
Regina Davis is a 30 y.o. G3P2002 at 7767w0d by ultrasound admitted for induction of labor due to Post dates. Due date 05/14/16.  Subjective:   Objective: BP 123/66   Pulse 81   Temp 97.8 F (36.6 C) (Oral)   Resp 18   Ht 5\' 7"  (1.702 m)   Wt 233 lb (105.7 kg)   LMP 08/17/2015 (Approximate)   BMI 36.49 kg/m  No intake/output data recorded. No intake/output data recorded.  FHT:  FHR: 140 bpm, variability: moderate,  accelerations:  Present,  decelerations:  Absent UC:   irregular, every 5-7 minutes SVE:   Dilation: 2.5 Effacement (%): 50 Station: -3 Exam by:: Davis, cnm  Labs: Lab Results  Component Value Date   WBC 10.6 (H) 05/21/2016   HGB 11.3 (L) 05/21/2016   HCT 33.9 (L) 05/21/2016   MCV 86.3 05/21/2016   PLT 251 05/21/2016    Assessment / Plan: Induction of labor due to postterm,  progressing well on pitocin  Labor: yet to be in labor Preeclampsia:  no signs or symptoms of toxicity Fetal Wellbeing:  Category I Pain Control:  Labor support without medications I/D:  n/a Anticipated MOD:  NSVD  Regina Davis 05/21/2016, 2:12 PM

## 2016-05-21 NOTE — Anesthesia Preprocedure Evaluation (Signed)
Anesthesia Evaluation  Patient identified by MRN, date of birth, ID band Patient awake    Reviewed: Allergy & Precautions, NPO status , Patient's Chart, lab work & pertinent test results  Airway Mallampati: II  TM Distance: >3 FB Neck ROM: Full    Dental  (+) Teeth Intact, Dental Advisory Given   Pulmonary neg pulmonary ROS,    Pulmonary exam normal breath sounds clear to auscultation       Cardiovascular negative cardio ROS Normal cardiovascular exam Rhythm:Regular Rate:Normal     Neuro/Psych  Headaches,    GI/Hepatic negative GI ROS, Neg liver ROS,   Endo/Other  Obesity   Renal/GU negative Renal ROS     Musculoskeletal negative musculoskeletal ROS (+)   Abdominal   Peds  Hematology negative hematology ROS (+) Blood dyscrasia, anemia , Plt 251k   Anesthesia Other Findings Day of surgery medications reviewed with the patient.  Reproductive/Obstetrics (+) Pregnancy                             Anesthesia Physical Anesthesia Plan  ASA: II  Anesthesia Plan: Epidural   Post-op Pain Management:    Induction:   Airway Management Planned:   Additional Equipment:   Intra-op Plan:   Post-operative Plan:   Informed Consent: I have reviewed the patients History and Physical, chart, labs and discussed the procedure including the risks, benefits and alternatives for the proposed anesthesia with the patient or authorized representative who has indicated his/her understanding and acceptance.   Dental advisory given  Plan Discussed with:   Anesthesia Plan Comments: (Patient identified. Risks/Benefits/Options discussed with patient including but not limited to bleeding, infection, nerve damage, paralysis, failed block, incomplete pain control, headache, blood pressure changes, nausea, vomiting, reactions to medication both or allergic, itching and postpartum back pain. Confirmed with bedside  nurse the patient's most recent platelet count. Confirmed with patient that they are not currently taking any anticoagulation, have any bleeding history or any family history of bleeding disorders. Patient expressed understanding and wished to proceed. All questions were answered. )        Anesthesia Quick Evaluation

## 2016-05-21 NOTE — Progress Notes (Signed)
Per Zerita Boersarlene Lawson and Dr Despina HiddenEure, please cut pit in half, then continue increasing 2x2. Cora Stetson [place] Kingston Guiles, [place]RN 05/21/2016 6:42 PM

## 2016-05-21 NOTE — H&P (Signed)
Regina Davis is a 30 y.o. female presenting for IOL for post dates. GBS pos.. OB History    Gravida Para Term Preterm AB Living   3 2 2     2    SAB TAB Ectopic Multiple Live Births           2     Past Medical History:  Diagnosis Date  . Migraine    Past Surgical History:  Procedure Laterality Date  . HERNIA REPAIR     Family History: family history includes Cancer in her maternal grandmother; Hypertension in her mother. Social History:  reports that she has never smoked. She has never used smokeless tobacco. She reports that she does not drink alcohol or use drugs.     Maternal Diabetes: No Genetic Screening: Normal Maternal Ultrasounds/Referrals: Normal Fetal Ultrasounds or other Referrals:  None Maternal Substance Abuse:  No Significant Maternal Medications:  None Significant Maternal Lab Results:  Lab values include: Group B Strep positive Other Comments:  None  ROS Maternal Medical History:  Reason for admission: Post dates induction  Contractions: none  Fetal activity: Perceived fetal activity is normal.   Last perceived fetal movement was within the past hour.    Prenatal complications: no prenatal complications Prenatal Complications - Diabetes: none.    Dilation: 2.5 Effacement (%): 50 Station: -3 Exam by:: Phelicia Dantes, cnm Blood pressure 118/71, pulse 96, temperature 98.2 F (36.8 C), temperature source Oral, resp. rate 18, height 5\' 7"  (1.702 m), weight 233 lb (105.7 kg), last menstrual period 08/17/2015. Maternal Exam:  Abdomen: Patient reports no abdominal tenderness. Fetal presentation: vertex  Introitus: Normal vulva. Normal vagina.  Ferning test: not done.  Nitrazine test: not done. Amniotic fluid character: not assessed.  Pelvis: adequate for delivery.   Cervix: Cervix evaluated by digital exam.     Fetal Exam Fetal Monitor Review: Mode: ultrasound.   Variability: moderate (6-25 bpm).   Pattern: accelerations present and no  decelerations.    Fetal State Assessment: Category I - tracings are normal.     Physical Exam  Constitutional: She is oriented to person, place, and time. She appears well-developed and well-nourished.  HENT:  Head: Normocephalic.  Eyes: Pupils are equal, round, and reactive to light.  Neck: Normal range of motion.  Cardiovascular: Normal rate, regular rhythm, normal heart sounds and intact distal pulses.   Respiratory: Effort normal and breath sounds normal.  GI: Soft. Bowel sounds are normal.  Genitourinary: Vagina normal and uterus normal.  Musculoskeletal: Normal range of motion.  Neurological: She is alert and oriented to person, place, and time. She has normal reflexes.  Skin: Skin is warm and dry.  Psychiatric: She has a normal mood and affect. Her behavior is normal. Judgment and thought content normal.    Prenatal labs: ABO, Rh: --/--/A POS (12/10 0816) Antibody: NEG (12/10 0816) Rubella: 9.57 (06/28 1334) RPR: Non Reactive (09/19 0917)  HBsAg: Negative (06/28 1334)  HIV: Non Reactive (09/19 0917)  GBS: Positive (12/08 0000)   Assessment/Plan: SVE 2-3/th/post/high FHR reassuring Pit induction of labor GBS prophylaxis    Wyvonnia DuskyMarie Gentle Hoge 05/21/2016, 10:32 AM

## 2016-05-21 NOTE — Anesthesia Pain Management Evaluation Note (Signed)
  CRNA Pain Management Visit Note  Patient: Regina Davis, 30 y.o., female  "Hello I am [facility]. We have an anesthesia team available at all times to provide care throughout the hospital, including epidural management and anesthesia for C-section. I don't know your plan for the delivery whether it a natural birth, water birth, IV sedation, nitrous supplementation, doula or epidural, but we want to meet your pain goals."   1.Was your pain managed to your expectations on prior hospitalizations?   Yes   2.What is your expectation for pain management during this hospitalization?     Epidural  3.How can we help you reach that goal? unsure  Record the patient's initial score and the patient's pain goal.   Pain: 3  Pain Goal: 8 [facility] wants you to be able to say your pain was always managed very well.  Cephus ShellingBURGER,Bluford Sedler 05/21/2016

## 2016-05-21 NOTE — Progress Notes (Signed)
Patient ID: Regina Davis No, female   DOB: 02/03/1986, 30 y.o.   MRN: [id number]  OB Interim Progress Note  S: patient is comfortable at this time.  Epidural in place.  Does not feel contractions and has no concerns at this time .  Family at bedside.   O: BP 130/70   Pulse 80   Temp 98.2 F (36.8 C) (Oral)   Resp 18   Ht 5\' 7"  (1.702 m)   Wt 233 lb (105.7 kg)   LMP 08/17/2015 (Approximate)   BMI 36.49 kg/m   FHT: Cat I.  Contractions irregular.   Dilation: 3 Effacement (%): 50 Cervical Position: Posterior Station: -3 Presentation: Vertex Exam by:: Avyana Puffenbarger cnm   A/P: No discomfort from uterine contractions.  VSS, FHR reassuring.  Epidural and IUPC placed.   Plan to AROM and continue pit augmentation.  Anticipate SVD  Freddrick MarchYashika Amin, MD 05/21/2016, 8:56 PM PGY-1

## 2016-05-21 NOTE — Anesthesia Procedure Notes (Signed)
Epidural Patient location during procedure: OB Start time: 05/21/2016 3:48 PM End time: 05/21/2016 3:53 PM  Staffing Anesthesiologist: Cecile HearingURK, STEPHEN EDWARD Performed: anesthesiologist   Preanesthetic Checklist Completed: patient identified, pre-op evaluation, timeout performed, IV checked, risks and benefits discussed and monitors and equipment checked  Epidural Patient position: sitting Prep: DuraPrep Patient monitoring: blood pressure and continuous pulse ox Approach: midline Location: L3-L4 Injection technique: LOR air  Needle:  Needle type: Tuohy  Needle gauge: 17 G Needle length: 9 cm Needle insertion depth: 7 cm Catheter size: 19 Gauge Catheter at skin depth: 12 cm Test dose: negative and Other (1% Lidocaine)  Additional Notes Patient identified.  Risk benefits discussed including failed block, incomplete pain control, headache, nerve damage, paralysis, blood pressure changes, nausea, vomiting, reactions to medication both toxic or allergic, and postpartum back pain.  Patient expressed understanding and wished to proceed.  All questions were answered.  Sterile technique used throughout procedure and epidural site dressed with sterile barrier dressing. No paresthesia or other complications noted. The patient did not experience any signs of intravascular injection such as tinnitus or metallic taste in mouth nor signs of intrathecal spread such as rapid motor block. Please see nursing notes for vital signs. Reason for block:procedure for pain

## 2016-05-21 NOTE — Progress Notes (Signed)
S: states mild discomfort from uc's O: VSS, FHR pattern reassuring. SVE 3/60/-2 AROM cl fluid noted, IUPC placed without difficulty A: IOL for post dates, stable maternal fetal unit P: AROM and continue pit aug

## 2016-05-22 ENCOUNTER — Encounter (HOSPITAL_COMMUNITY): Payer: Self-pay | Admitting: *Deleted

## 2016-05-22 DIAGNOSIS — O480 Post-term pregnancy: Secondary | ICD-10-CM

## 2016-05-22 DIAGNOSIS — Z3A41 41 weeks gestation of pregnancy: Secondary | ICD-10-CM

## 2016-05-22 DIAGNOSIS — O99824 Streptococcus B carrier state complicating childbirth: Secondary | ICD-10-CM

## 2016-05-22 LAB — RPR: RPR: NONREACTIVE

## 2016-05-22 MED ORDER — DIPHENHYDRAMINE HCL 25 MG PO CAPS: 25.0000 mg | ORAL_CAPSULE | Freq: Four times a day (QID) | ORAL | Status: DC | PRN

## 2016-05-22 MED ORDER — ONDANSETRON HCL 4 MG PO TABS: 4.0000 mg | ORAL_TABLET | ORAL | Status: DC | PRN

## 2016-05-22 MED ORDER — MEASLES, MUMPS & RUBELLA VAC ~~LOC~~ INJ: 0.5000 mL | INJECTION | Freq: Once | SUBCUTANEOUS | Status: DC

## 2016-05-22 MED ORDER — FERROUS SULFATE 325 (65 FE) MG PO TABS
325.0000 mg | ORAL_TABLET | Freq: Two times a day (BID) | ORAL | Status: DC
  Administered 2016-05-22 – 2016-05-23 (×3): 325 mg via ORAL
  Filled 2016-05-22 (×3): qty 1

## 2016-05-22 MED ORDER — ZOLPIDEM TARTRATE 5 MG PO TABS: 5.0000 mg | ORAL_TABLET | Freq: Every evening | ORAL | Status: DC | PRN

## 2016-05-22 MED ORDER — IBUPROFEN 600 MG PO TABS
600.0000 mg | ORAL_TABLET | Freq: Four times a day (QID) | ORAL | Status: DC
  Administered 2016-05-22 – 2016-05-23 (×6): 600 mg via ORAL
  Filled 2016-05-22 (×6): qty 1

## 2016-05-22 MED ORDER — ONDANSETRON HCL 4 MG/2ML IJ SOLN: 4.0000 mg | INTRAMUSCULAR | Status: DC | PRN

## 2016-05-22 MED ORDER — SENNOSIDES-DOCUSATE SODIUM 8.6-50 MG PO TABS
2.0000 | ORAL_TABLET | ORAL | Status: DC
  Filled 2016-05-22: qty 2

## 2016-05-22 MED ORDER — TETANUS-DIPHTH-ACELL PERTUSSIS 5-2.5-18.5 LF-MCG/0.5 IM SUSP: 0.5000 mL | Freq: Once | INTRAMUSCULAR | Status: DC

## 2016-05-22 MED ORDER — SIMETHICONE 80 MG PO CHEW: 80.0000 mg | CHEWABLE_TABLET | ORAL | Status: DC | PRN

## 2016-05-22 MED ORDER — WITCH HAZEL-GLYCERIN EX PADS: 1.0000 "application " | MEDICATED_PAD | CUTANEOUS | Status: DC | PRN

## 2016-05-22 MED ORDER — ACETAMINOPHEN 325 MG PO TABS
650.0000 mg | ORAL_TABLET | ORAL | Status: DC | PRN
  Administered 2016-05-22: 650 mg via ORAL
  Filled 2016-05-22: qty 2

## 2016-05-22 MED ORDER — DIBUCAINE 1 % RE OINT: 1.0000 "application " | TOPICAL_OINTMENT | RECTAL | Status: DC | PRN

## 2016-05-22 MED ORDER — SODIUM CHLORIDE 0.9% FLUSH: 3.0000 mL | INTRAVENOUS | Status: DC | PRN

## 2016-05-22 MED ORDER — SODIUM CHLORIDE 0.9 % IV SOLN: 250.0000 mL | INTRAVENOUS | Status: DC | PRN

## 2016-05-22 MED ORDER — COCONUT OIL OIL
1.0000 "application " | TOPICAL_OIL | Status: DC | PRN
  Administered 2016-05-22: 1 via TOPICAL
  Filled 2016-05-22: qty 120

## 2016-05-22 MED ORDER — SODIUM CHLORIDE 0.9% FLUSH: 3.0000 mL | Freq: Two times a day (BID) | INTRAVENOUS | Status: DC

## 2016-05-22 MED ORDER — BENZOCAINE-MENTHOL 20-0.5 % EX AERO: 1.0000 "application " | INHALATION_SPRAY | CUTANEOUS | Status: DC | PRN

## 2016-05-22 MED ORDER — PRENATAL MULTIVITAMIN CH
1.0000 | ORAL_TABLET | Freq: Every day | ORAL | Status: DC
  Administered 2016-05-22 – 2016-05-23 (×2): 1 via ORAL
  Filled 2016-05-22 (×2): qty 1

## 2016-05-22 NOTE — Anesthesia Postprocedure Evaluation (Signed)
Anesthesia Post Note  Patient: Regina Davis  Procedure(s) Performed: * No procedures listed *  Patient location during evaluation: Mother Baby Anesthesia Type: Epidural Level of consciousness: awake and alert Pain management: pain level controlled Vital Signs Assessment: post-procedure vital signs reviewed and stable Respiratory status: spontaneous breathing, nonlabored ventilation and respiratory function stable Cardiovascular status: stable Postop Assessment: no headache, no backache and epidural receding Anesthetic complications: no     Last Vitals:  Vitals:   05/22/16 0230 05/22/16 0350  BP: 127/70 (!) 133/59  Pulse: 87 87  Resp: 20 20  Temp: 37.6 C 37 C    Last Pain:  Vitals:   05/22/16 0607  TempSrc:   PainSc: 4    Pain Goal: Patients Stated Pain Goal: 2 (05/22/16 [id number])               Junious SilkGILBERT,Zia Najera

## 2016-05-22 NOTE — Progress Notes (Signed)
UR chart review completed.  

## 2016-05-22 NOTE — Lactation Note (Signed)
This note was copied from a baby's chart. Lactation Consultation Note  P3, Ex BF 13 months & 19 months. Baby 14 hours old.  Baby latched upon entering on L side. Sucks and swallows observed. Denies problems or questions. Mom encouraged to feed baby 8-12 times/24 hours and with feeding cues.  Mom made aware of O/P services, breastfeeding support groups, community resources, and our phone # for post-discharge questions.    Patient Name: Timoteo AceBoy Jose Shimmin ZOXWR'UToday's Date: 05/22/2016     Maternal Data    Feeding    LATCH Score/Interventions                      Lactation Tools Discussed/Used     Consult Status      Dahlia ByesBerkelhammer, Ruth [facility] 05/22/2016, 2:53 PM

## 2016-05-23 MED ORDER — IBUPROFEN 600 MG PO TABS: 600.0000 mg | ORAL_TABLET | Freq: Four times a day (QID) | ORAL | 0 refills | Status: DC

## 2016-05-23 MED ORDER — SENNOSIDES-DOCUSATE SODIUM 8.6-50 MG PO TABS
2.0000 | ORAL_TABLET | ORAL | 0 refills | Status: DC
Start: 2016-05-24 — End: 2016-07-21

## 2016-05-23 NOTE — Lactation Note (Addendum)
This note was copied from a baby's chart. Lactation Consultation Note  Patient Name: Regina Davis [name]'s Date: 05/23/2016 Reason for consult: Follow-up assessment;Difficult latch  Baby 36 hours old. [facility] room. Mom reports that both of her older children did the same thing when they were first nursing. Assisted mom to latch baby to right breast in football position. Baby latched deeply, but baby suckles with a loud clicking/chomping sound. When baby offered this LC's gloved finger, baby bites and strokes finger with tongue but never created a seal or suckled. Mom is able to hold breast in a very specific, supporting manner that allows the baby to suckle and diminishes the clicking/chomping sound. Mom states that her 2 older children eventually learned to nurse more effectively. Mom states that she intends to start post-pumping when she gets home today, just as she did with her older children--543 and 661 years of age. Mom also reports that the baby nurses until he is satisfied and is having many diapers. Offered mom a follow-up appointment but she declined stating that she would call if needed.    Maternal Data Has patient been taught Hand Expression?: Yes (several large drops noted ) Does the patient have breastfeeding experience prior to this delivery?: Yes  Feeding Feeding Type: Breast Fed Length of feed: 3 min (few sucks and swallows )  LATCH Score/Interventions Latch: Repeated attempts needed to sustain latch, nipple held in mouth throughout feeding, stimulation needed to elicit sucking reflex. Intervention(s): Adjust position;Assist with latch;Breast compression  Audible Swallowing: A few with stimulation Intervention(s): Hand expression  Type of Nipple: Everted at rest and after stimulation  Comfort (Breast/Nipple): Soft / non-tender     Hold (Positioning): No assistance needed to correctly position infant at  breast.  LATCH Score: 8  Lactation Tools Discussed/Used     Consult Status Consult Status: PRN Date: 05/23/16 Follow-up type: In-patient    Sherlyn HayJennifer D D[employer] Hands 05/23/2016, 12:18 PM

## 2016-05-23 NOTE — Discharge Summary (Signed)
OB Discharge Summary     Patient Name: Regina Davis DOB: 10/12/1985 MRN: [id number]  Date of admission: 05/21/2016 Delivering Regina Davis: Wyvonnia DuskyLAWSON, MARIE D   Date of discharge: 05/23/2016  Admitting diagnosis: 6241 WEEKS INDICTION Intrauterine pregnancy: 4223w1d     Secondary diagnosis:  Active Problems:   Labor and delivery, indication for care  Additional problems: late prenatal care     Discharge diagnosis: Term Pregnancy Delivered                                                                                                Post partum procedures:None  Augmentation: AROM and Pitocin  Complications: None  Hospital course:  Onset of Labor With Vaginal Delivery     30 y.o. yo [id number] at 7123w1d was admitted in Active Labor on 05/21/2016. Patient had an uncomplicated labor course as follows:  Membrane Rupture Time/Date: 6:00 PM ,05/21/2016   Intrapartum Procedures: Episiotomy: None [1]                                         Lacerations:  None [1]  Patient had a delivery of a Viable infant. 05/22/2016  Information for the patient's newborn:  Regina Davis, Regina Davis [[id number]][[id number]]  Delivery Method: Vaginal, Spontaneous Delivery (Filed from Delivery Summary)    Pateint had an uncomplicated postpartum course.  She is ambulating, tolerating a regular diet, passing flatus, and urinating well. Patient is discharged home in stable condition on 05/23/16.    Physical exam Vitals:   05/22/16 0230 05/22/16 0350 05/22/16 0835 05/23/16 0655  BP: 127/70 (!) 133/59 (!) 105/56 (!) 108/55  Pulse: 87 87 69 79  Resp: 20 20 18 17   Temp: 99.7 F (37.6 C) 98.6 F (37 C) 98.4 F (36.9 C) 98.9 F (37.2 C)  TempSrc: Oral Oral Oral Oral  SpO2: 99% 99%    Weight:      Height:       General: alert, cooperative and no distress Lochia: appropriate Uterine Fundus: firm Incision: N/A DVT Evaluation: No evidence of DVT seen on physical exam. Labs: Lab Results  Component Value Date   WBC 10.6 (H)  05/21/2016   HGB 11.3 (L) 05/21/2016   HCT 33.9 (L) 05/21/2016   MCV 86.3 05/21/2016   PLT 251 05/21/2016   CMP Latest Ref Rng & Units 02/10/2010  Glucose 70 - 99 mg/dL 89  BUN 6 - 23 mg/dL 8  Creatinine 0.4 - 1.2 mg/dL 0.8  Sodium 696135 - 295145 mEq/L 140  Potassium 3.5 - 5.1 mEq/L 3.7  Chloride 96 - 112 mEq/L 105    Discharge instruction: per After Visit Summary and "Baby and Me Booklet".  After visit meds:    Medication List    TAKE these medications   ferrous sulfate 325 (65 FE) MG tablet Take 1 tablet (325 mg total) by mouth 2 (two) times daily with a meal.   ibuprofen 600 MG tablet Commonly known as:  ADVIL,MOTRIN Take 1 tablet (600 mg total) by mouth every  6 (six) hours.   prenatal vitamin w/FE, FA 27-1 MG Tabs tablet Take 1 tablet by mouth daily at 12 noon.   senna-docusate 8.6-50 MG tablet Commonly known as:  Senokot-S Take 2 tablets by mouth daily. Start taking on:  05/24/2016       Diet: routine diet  Activity: Advance as tolerated. Pelvic rest for 6 weeks.   Outpatient follow up:6 weeks Follow up Appt:No future appointments. Follow up Visit:No Follow-up on file.  Postpartum contraception: Undecided  Newborn Data: Live born female  Birth Weight: 7 lb 9 oz (3430 g) APGAR: 8, 8  Baby Feeding: Breast Disposition:home with mother   05/23/2016 Regina MarchYashika Amin, Regina Davis   Regina Davis attestation I have seen and examined this patient and agree with above documentation in the resident's note.   Regina Davis is a 30 y.o. [id number] s/p SVD.   Pain is well controlled.  Plan for birth control is undecided.  Method of Feeding: breast  PE:  BP (!) 108/55 (BP Location: Left Arm)   Pulse 79   Temp 98.9 F (37.2 C) (Oral)   Resp 17   Ht 5\' 7"  (1.702 m)   Wt 105.7 kg (233 lb)   LMP 08/17/2015 (Approximate)   SpO2 99%   Breastfeeding? Unknown   BMI 36.49 kg/m  Fundus firm   Recent Labs  05/21/16 0816  HGB 11.3*  HCT 33.9*     Plan: discharge today -  postpartum care discussed - f/u clinic in 6 weeks for postpartum visit   Cam HaiSHAW, Lee-Ann Gal, Regina Davis 8:53 AM  05/23/2016

## 2016-05-23 NOTE — Discharge Instructions (Signed)

## 2016-05-23 NOTE — Lactation Note (Signed)
This note was copied from a baby's chart. Lactation Consultation Note  Patient Name: Regina Davis JXBJY'NToday's Date: 05/23/2016 Reason for consult: Follow-up assessment (mom aware to page with  feeding cues )  Baby is 3333 hours old and has been to the breast consistently . Per [facility]BU RN - Judeth CornfieldStephanie  Per mom concerned that baby may have  A lip tie. LC 1st changed large wet diaper and placed baby STS In cross cradle. Areola compressible , and easily expressed milk, baby opened wide and noted to be mouthing  The tissue, LC assisted to obtain the depth and significant rubbing noise noted, and baby is stuffy. Baby released  And didn't act hungry after few sucks and swallows.  LC encouraged and recommended for mom to call with feeding cues for F/U feeding assessment.  Mother informed of post-discharge support and given phone number to the lactation department, including services for phone call assistance; out-patient appointments; and breastfeeding support group. List of other breastfeeding resources in the community given in the handout. Encouraged mother to call for problems or concerns related to breastfeeding.   Maternal Data Has patient been taught Hand Expression?: Yes (several large drops noted ) Does the patient have breastfeeding experience prior to this delivery?: Yes  Feeding Feeding Type: Breast Fed Length of feed: 3 min (few sucks and swallows )  LATCH Score/Interventions Latch: Repeated attempts needed to sustain latch, nipple held in mouth throughout feeding, stimulation needed to elicit sucking reflex.  Audible Swallowing: A few with stimulation  Type of Nipple: Everted at rest and after stimulation  Comfort (Breast/Nipple): Soft / non-tender  Problem noted: Mild/Moderate discomfort Interventions (Mild/moderate discomfort): Hand massage;Hand expression  Hold (Positioning): No assistance needed to correctly position infant at breast.  LATCH Score: 8  Lactation Tools  Discussed/Used     Consult Status Consult Status: Follow-up Date: 05/23/16 Follow-up type: In-patient    Matilde SprangMargaret Ann [name] 05/23/2016, 9:56 AM

## 2016-05-23 NOTE — Plan of Care (Signed)
Problem: Life Cycle: Goal: Risk for postpartum hemorrhage will decrease Outcome: Completed/Met Date Met: 05/23/16 Discussed bleeding at home and reviewed discharge paperwork with patient. Patient verbalizes understanding of information.

## 2016-07-03 ENCOUNTER — Encounter: Payer: Self-pay | Admitting: Women's Health

## 2016-07-03 ENCOUNTER — Ambulatory Visit (INDEPENDENT_AMBULATORY_CARE_PROVIDER_SITE_OTHER): Payer: Medicaid Other | Admitting: Women's Health

## 2016-07-03 DIAGNOSIS — Z8481 Family history of carrier of genetic disease: Secondary | ICD-10-CM | POA: Insufficient documentation

## 2016-07-03 NOTE — Patient Instructions (Signed)
NO SEX UNTIL AFTER YOU GET YOUR tubes tied

## 2016-07-03 NOTE — Progress Notes (Signed)
Subjective:    Regina Davis is a 31 y.o. G48P3003 African American female who presents for a postpartum visit. She is 5 weeks postpartum following a spontaneous vaginal delivery at 41.1 gestational weeks after IOL for postdates. Anesthesia: epidural. I have fully reviewed the prenatal and intrapartum course. Postpartum course has been uncomplicated. Baby's course has been uncomplicated. Baby is feeding by breast. Bleeding no bleeding. Bowel function is normal. Bladder function is normal. Patient is sexually active. Last sexual activity: unprotected last night 1/21. Contraception method is none and has decided she wants BTL. Postpartum depression screening: negative. Score 4.  Last pap 2016 in [place] and was neg. Pt notifies me today her mom and her cousin are both BRCA+ and pt requests testing.  The following portions of the patient's history were reviewed and updated as appropriate: allergies, current medications, past medical history, past surgical history and problem list.  Review of Systems Pertinent items are noted in HPI.   Vitals:   07/03/16 1431  BP: 104/66  Pulse: 66  Weight: 224 lb (101.6 kg)  Height: '5\' 7"'  (1.702 m)   No LMP recorded.  Objective:   General:  alert, cooperative and no distress   Breasts:  deferred, no complaints  Lungs: clear to auscultation bilaterally  Heart:  regular rate and rhythm  Abdomen: soft, nontender   Vulva: normal  Vagina: normal vagina  Cervix:  closed  Corpus: Well-involuted  Adnexa:  Non-palpable  Rectal Exam: No hemorrhoids        Assessment:   Postpartum exam 5 wks s/p SVB after IOL for postdates Breastfeeding Family hx +BRCA gene (mother and cousin) Depression screening Contraception counseling   Plan:  Contraception: abstinence until BTL/salpingectomy  Reviewed risks and benefits of interval salpingectomy, Consent signed today, per Dawn still has to be 30d from time of signing consent, so no earlier than 2/22 Per pt  request, sent referral to [facility] for appt for genetic counseling/possible BRCA testing, pt to await their call to schedule appt, let us know if doesn't hear from them Follow up in: 1 week for pre-op w/ LHE, or earlier if needed  Tawnya Crook CNM, [facility] 07/03/2016 2:35 PM

## 2016-07-11 ENCOUNTER — Encounter: Payer: Medicaid Other | Admitting: Obstetrics & Gynecology

## 2016-07-21 ENCOUNTER — Ambulatory Visit (INDEPENDENT_AMBULATORY_CARE_PROVIDER_SITE_OTHER): Payer: Medicaid Other | Admitting: Obstetrics & Gynecology

## 2016-07-21 ENCOUNTER — Encounter: Payer: Self-pay | Admitting: Obstetrics & Gynecology

## 2016-07-21 VITALS — BP 118/80 | HR 74 | Ht 67.0 in | Wt 228.0 lb

## 2016-07-21 DIAGNOSIS — Z3009 Encounter for other general counseling and advice on contraception: Secondary | ICD-10-CM

## 2016-07-23 NOTE — Progress Notes (Signed)
Preoperative History and Physical  Regina Davis is a 31 y.o. [id number] with No LMP recorded. admitted for a laparoscopic bilateral tubal fulguration with partial transection.    PMH:    Past Medical History:  Diagnosis Date  . Migraine     PSH:     Past Surgical History:  Procedure Laterality Date  . HERNIA REPAIR      POb/GynH:      OB History    Gravida Para Term Preterm AB Living   3 3 3     3    SAB TAB Ectopic Multiple Live Births         0 3      SH:   Social History  Substance Use Topics  . Smoking status: Never Smoker  . Smokeless tobacco: Never Used  . Alcohol use No    FH:    Family History  Problem Relation Age of Onset  . Hypertension Mother   . Cancer Maternal Grandmother     breast     Allergies: No Known Allergies  Medications:       Current Outpatient Prescriptions:  .  prenatal vitamin w/FE, FA (PRENATAL 1 + 1) 27-1 MG TABS tablet, Take 1 tablet by mouth daily at 12 noon., Disp: 30 each, Rfl: 12  Review of Systems:   Review of Systems  Constitutional: Negative for fever, chills, weight loss, malaise/fatigue and diaphoresis.  HENT: Negative for hearing loss, ear pain, nosebleeds, congestion, sore throat, neck pain, tinnitus and ear discharge.   Eyes: Negative for blurred vision, double vision, photophobia, pain, discharge and redness.  Respiratory: Negative for cough, hemoptysis, sputum production, shortness of breath, wheezing and stridor.   Cardiovascular: Negative for chest pain, palpitations, orthopnea, claudication, leg swelling and PND.  Gastrointestinal: Positive for abdominal pain. Negative for heartburn, nausea, vomiting, diarrhea, constipation, blood in stool and melena.  Genitourinary: Negative for dysuria, urgency, frequency, hematuria and flank pain.  Musculoskeletal: Negative for myalgias, back pain, joint pain and falls.  Skin: Negative for itching and rash.  Neurological: Negative for dizziness, tingling, tremors, sensory  change, speech change, focal weakness, seizures, loss of consciousness, weakness and headaches.  Endo/Heme/Allergies: Negative for environmental allergies and polydipsia. Does not bruise/bleed easily.  Psychiatric/Behavioral: Negative for depression, suicidal ideas, hallucinations, memory loss and substance abuse. The patient is not nervous/anxious and does not have insomnia.      PHYSICAL EXAM:  Blood pressure 118/80, pulse 74, height 5\' 7"  (1.702 m), weight 228 lb (103.4 kg), currently breastfeeding.    Vitals reviewed. Constitutional: She is oriented to person, place, and time. She appears well-developed and well-nourished.  HENT:  Head: Normocephalic and atraumatic.  Right Ear: External ear normal.  Left Ear: External ear normal.  Nose: Nose normal.  Mouth/Throat: Oropharynx is clear and moist.  Eyes: Conjunctivae and EOM are normal. Pupils are equal, round, and reactive to light. Right eye exhibits no discharge. Left eye exhibits no discharge. No scleral icterus.  Neck: Normal range of motion. Neck supple. No tracheal deviation present. No thyromegaly present.  Cardiovascular: Normal rate, regular rhythm, normal heart sounds and intact distal pulses.  Exam reveals no gallop and no friction rub.   No murmur heard. Respiratory: Effort normal and breath sounds normal. No respiratory distress. She has no wheezes. She has no rales. She exhibits no tenderness.  GI: Soft. Bowel sounds are normal. She exhibits no distension and no mass. There is tenderness. There is no rebound and no guarding.  Genitourinary:  Vulva is normal without lesions Vagina is pink moist without discharge Cervix normal in appearance and pap is normal Uterus is normal size, contour, position, consistency, mobility, non-tender Adnexa is negative with normal sized ovaries by sonogram  Musculoskeletal: Normal range of motion. She exhibits no edema and no tenderness.  Neurological: She is alert and oriented to  person, place, and time. She has normal reflexes. She displays normal reflexes. No cranial nerve deficit. She exhibits normal muscle tone. Coordination normal.  Skin: Skin is warm and dry. No rash noted. No erythema. No pallor.  Psychiatric: She has a normal mood and affect. Her behavior is normal. Judgment and thought content normal.    Labs: No results found for this or any previous visit (from the past 336 hour(s)).  EKG: No orders found for this or any previous visit.  Imaging Studies: No results found.    Assessment: Multiparous feamle desires permanent sterilization  Plan: Laparoscopic bilateral tubal ligation, fulguration with partial transection  Lazaro Arms, MD  07/23/2016 7:42 PM

## 2016-08-03 ENCOUNTER — Other Ambulatory Visit: Payer: Self-pay | Admitting: Obstetrics & Gynecology

## 2016-08-03 NOTE — Patient Instructions (Signed)
Regina Davis  08/03/2016     @PREFPERIOPPHARMACY @   Your procedure is scheduled on  08/09/2016   Report to [facility] at  700  A.M.  Call this number if you have problems the morning of surgery:  [phone]   Remember:  Do not eat food or drink liquids after midnight.  Take these medicines the morning of surgery with A SIP OF WATER  None   Do not wear jewelry, make-up or nail polish.  Do not wear lotions, powders, or perfumes, or deoderant.  Do not shave 48 hours prior to surgery.  Men may shave face and neck.  Do not bring valuables to the hospital.  [facility] is not responsible for any belongings or valuables.  Contacts, dentures or bridgework may not be worn into surgery.  Leave your suitcase in the car.  After surgery it may be brought to your room.  For patients admitted to the hospital, discharge time will be determined by your treatment team.  Patients discharged the day of surgery will not be allowed to drive home.   Name and phone number of your driver:   family Special instructions:  None  Please read over the following fact sheets that you were given. Anesthesia Post-op Instructions and Care and Recovery After Surgery       Laparoscopic Tubal Ligation Introduction Laparoscopic tubal ligation is a procedure to close the fallopian tubes. This is done so that you cannot get pregnant. When the fallopian tubes are closed, the eggs that your ovaries release cannot enter the uterus, and sperm cannot reach the released eggs. A laparoscopic tubal ligation is sometimes called "getting your tubes tied." You should not have this procedure if you want to get pregnant someday or if you are unsure about having more children. Tell a health care provider about:  Any allergies you have.  All medicines you are taking, including vitamins, herbs, eye drops, creams, and over-the-counter medicines.  Any problems you or family members have had with  anesthetic medicines.  Any blood disorders you have.  Any surgeries you have had.  Any medical conditions you have.  Whether you are pregnant or may be pregnant.  Any past pregnancies. What are the risks? Generally, this is a safe procedure. However, problems may occur, including:  Infection.  Bleeding.  Injury to surrounding organs.  Side effects from anesthetics.  Failure of the procedure. This procedure can increase your risk of a kind of pregnancy in which a fertilized egg attaches to the outside of the uterus (ectopic pregnancy). What happens before the procedure?  Ask your health care provider about:  Changing or stopping your regular medicines. This is especially important if you are taking diabetes medicines or blood thinners.  Taking medicines such as aspirin and ibuprofen. These medicines can thin your blood. Do not take these medicines before your procedure if your health care provider instructs you not to.  Follow instructions from your health care provider about eating and drinking restrictions.  Plan to have someone take you home after the procedure.  If you go home right after the procedure, plan to have someone with you for 24 hours. What happens during the procedure?  You will be given one or more of the following:  A medicine to help you relax (sedative).  A medicine to numb the area (local anesthetic).  A medicine to make you fall asleep (general anesthetic).  A medicine that is  injected into an area of your body to numb everything below the injection site (regional anesthetic).  An IV tube will be inserted into one of your veins. It will be used to give you medicines and fluids during the procedure.  Your bladder may be emptied with a small tube (catheter).  If you have been given a general anesthetic, a tube will be put down your throat to help you breathe.  Two small cuts (incisions) will be made in your lower abdomen and near your belly  button.  Your abdomen will be inflated with a gas. This will let the surgeon see better and will give the surgeon room to work.  A thin, lighted tube (laparoscope) with a camera attached will be inserted into your abdomen through one of the incisions. Small instruments will be inserted through the other incision.  The fallopian tubes will be tied off, burned (cauterized), or blocked with a clip, ring, or clamp. A small portion in the center of each fallopian tube may be removed.  The gas will be released from the abdomen.  The incisions will be closed with stitches (sutures).  A bandage (dressing) will be placed over the incisions. The procedure may vary among health care providers and hospitals. What happens after the procedure?  Your blood pressure, heart rate, breathing rate, and blood oxygen level will be monitored often until the medicines you were given have worn off.  You will be given medicine to help with pain, nausea, and vomiting as needed. This information is not intended to replace advice given to you by your health care provider. Make sure you discuss any questions you have with your health care provider. Document Released: 09/04/2000 Document Revised: 11/04/2015 Document Reviewed: 05/09/2015  2017 Elsevier  Laparoscopic Tubal Ligation, Care After Refer to this sheet in the next few weeks. These instructions provide you with information about caring for yourself after your procedure. Your health care provider may also give you more specific instructions. Your treatment has been planned according to current medical practices, but problems sometimes occur. Call your health care provider if you have any problems or questions after your procedure. What can I expect after the procedure? After the procedure, it is common to have:  A sore throat.  Discomfort in your shoulder.  Mild discomfort or cramping in your abdomen.  Gas pains.  Pain or soreness in the area where the  surgical cut (incision) was made.  A bloated feeling.  Tiredness.  Nausea.  Vomiting. Follow these instructions at home: Medicines  Take over-the-counter and prescription medicines only as told by your health care provider.  Do not take aspirin because it can cause bleeding.  Do not drive or operate heavy machinery while taking prescription pain medicine. Activity  Rest for the rest of the day.  Return to your normal activities as told by your health care provider. Ask your health care provider what activities are safe for you. Incision care  Follow instructions from your health care provider about how to take care of your incision. Make sure you:  Wash your hands with soap and water before you change your bandage (dressing). If soap and water are not available, use hand sanitizer.  Change your dressing as told by your health care provider.  Leave stitches (sutures) in place. They may need to stay in place for 2 weeks or longer.  Check your incision area every day for signs of infection. Check for:  More redness, swelling, or pain.  More fluid  or blood.  Warmth.  Pus or a bad smell. Other Instructions  Do not take baths, swim, or use a hot tub until your health care provider approves. You may take showers.  Keep all follow-up visits as told by your health care provider. This is important.  Have someone help you with your daily household tasks for the first few days. Contact a health care provider if:  You have more redness, swelling, or pain around your incision.  Your incision feels warm to the touch.  You have pus or a bad smell coming from your incision.  The edges of your incision break open after the sutures have been removed.  Your pain does not improve after 2-3 days.  You have a rash.  You repeatedly become dizzy or light-headed.  Your pain medicine is not helping.  You are constipated. Get help right away if:  You have a fever.  You  faint.  You have increasing pain in your abdomen.  You have severe pain in one or both of your shoulders.  You have fluid or blood coming from your sutures or from your vagina.  You have shortness of breath or difficulty breathing.  You have chest pain or leg pain.  You have ongoing nausea, vomiting, or diarrhea. This information is not intended to replace advice given to you by your health care provider. Make sure you discuss any questions you have with your health care provider. Document Released: 12/16/2004 Document Revised: 11/01/2015 Document Reviewed: 05/09/2015 Elsevier Interactive Patient Education  2017 [employer]  General Anesthesia, Adult General anesthesia is the use of medicines to make a person "go to sleep" (be unconscious) for a medical procedure. General anesthesia is often recommended when a procedure:  Is long.  Requires you to be still or in an unusual position.  Is major and can cause you to lose blood.  Is impossible to do without general anesthesia. The medicines used for general anesthesia are called general anesthetics. In addition to making you sleep, the medicines:  Prevent pain.  Control your blood pressure.  Relax your muscles. Tell a health care provider about:  Any allergies you have.  All medicines you are taking, including vitamins, herbs, eye drops, creams, and over-the-counter medicines.  Any problems you or family members have had with anesthetic medicines.  Types of anesthetics you have had in the past.  Any bleeding disorders you have.  Any surgeries you have had.  Any medical conditions you have.  Any history of heart or lung conditions, such as heart failure, sleep apnea, or chronic obstructive pulmonary disease (COPD).  Whether you are pregnant or may be pregnant.  Whether you use tobacco, alcohol, marijuana, or street drugs.  Any history of Financial plannermilitary service.  Any history of depression or anxiety. What are the  risks? Generally, this is a safe procedure. However, problems may occur, including:  Allergic reaction to anesthetics.  Lung and heart problems.  Inhaling food or liquids from your stomach into your lungs (aspiration).  Injury to nerves.  Waking up during your procedure and being unable to move (rare).  Extreme agitation or a state of mental confusion (delirium) when you wake up from the anesthetic.  Air in the bloodstream, which can lead to stroke. These problems are more likely to develop if you are having a major surgery or if you have an advanced medical condition. You can prevent some of these complications by answering all of your health care provider's questions thoroughly and by following  all pre-procedure instructions. General anesthesia can cause side effects, including:  Nausea or vomiting  A sore throat from the breathing tube.  Feeling cold or shivery.  Feeling tired, washed out, or achy.  Sleepiness or drowsiness.  Confusion or agitation. What happens before the procedure? Staying hydrated  Follow instructions from your health care provider about hydration, which may include:  Up to 2 hours before the procedure - you may continue to drink clear liquids, such as water, clear fruit juice, black coffee, and plain tea. Eating and drinking restrictions  Follow instructions from your health care provider about eating and drinking, which may include:  8 hours before the procedure - stop eating heavy meals or foods such as meat, fried foods, or fatty foods.  6 hours before the procedure - stop eating light meals or foods, such as toast or cereal.  6 hours before the procedure - stop drinking milk or drinks that contain milk.  2 hours before the procedure - stop drinking clear liquids. Medicines  Ask your health care provider about:  Changing or stopping your regular medicines. This is especially important if you are taking diabetes medicines or blood  thinners.  Taking medicines such as aspirin and ibuprofen. These medicines can thin your blood. Do not take these medicines before your procedure if your health care provider instructs you not to.  Taking new dietary supplements or medicines. Do not take these during the week before your procedure unless your health care provider approves them.  If you are told to take a medicine or to continue taking a medicine on the day of the procedure, take the medicine with sips of water. General instructions   Ask if you will be going home the same day, the following day, or after a longer hospital stay.  Plan to have someone take you home.  Plan to have someone stay with you for the first 24 hours after you leave the hospital or clinic.  For 3-6 weeks before the procedure, try not to use any tobacco products, such as cigarettes, chewing tobacco, and e-cigarettes.  You may brush your teeth on the morning of the procedure, but make sure to spit out the toothpaste. What happens during the procedure?  You will be given anesthetics through a mask and through an IV tube in one of your veins.  You may receive medicine to help you relax (sedative).  As soon as you are asleep, a breathing tube may be used to help you breathe.  An anesthesia specialist will stay with you throughout the procedure. He or she will help keep you comfortable and safe by continuing to give you medicines and adjusting the amount of medicine that you get. He or she will also watch your blood pressure, pulse, and oxygen levels to make sure that the anesthetics do not cause any problems.  If a breathing tube was used to help you breathe, it will be removed before you wake up. The procedure may vary among health care providers and hospitals. What happens after the procedure?  You will wake up, often slowly, after the procedure is complete, usually in a recovery area.  Your blood pressure, heart rate, breathing rate, and blood  oxygen level will be monitored until the medicines you were given have worn off.  You may be given medicine to help you calm down if you feel anxious or agitated.  If you will be going home the same day, your health care provider may check to make sure  you can stand, drink, and urinate.  Your health care providers will treat your pain and side effects before you go home.  Do not drive for 24 hours if you received a sedative.  You may:  Feel nauseous and vomit.  Have a sore throat.  Have mental slowness.  Feel cold or shivery.  Feel sleepy.  Feel tired.  Feel sore or achy, even in parts of your body where you did not have surgery. This information is not intended to replace advice given to you by your health care provider. Make sure you discuss any questions you have with your health care provider. Document Released: 09/05/2007 Document Revised: 11/09/2015 Document Reviewed: 05/13/2015 Elsevier Interactive Patient Education  2017 [employer] General Anesthesia, Adult, Care After These instructions provide you with information about caring for yourself after your procedure. Your health care provider may also give you more specific instructions. Your treatment has been planned according to current medical practices, but problems sometimes occur. Call your health care provider if you have any problems or questions after your procedure. What can I expect after the procedure? After the procedure, it is common to have:  Vomiting.  A sore throat.  Mental slowness. It is common to feel:  Nauseous.  Cold or shivery.  Sleepy.  Tired.  Sore or achy, even in parts of your body where you did not have surgery. Follow these instructions at home: For at least 24 hours after the procedure:  Do not:  Participate in activities where you could fall or become injured.  Drive.  Use heavy machinery.  Drink alcohol.  Take sleeping pills or medicines that cause  drowsiness.  Make important decisions or sign legal documents.  Take care of children on your own.  Rest. Eating and drinking  If you vomit, drink water, juice, or soup when you can drink without vomiting.  Drink enough fluid to keep your urine clear or pale yellow.  Make sure you have little or no nausea before eating solid foods.  Follow the diet recommended by your health care provider. General instructions  Have a responsible adult stay with you until you are awake and alert.  Return to your normal activities as told by your health care provider. Ask your health care provider what activities are safe for you.  Take over-the-counter and prescription medicines only as told by your health care provider.  If you smoke, do not smoke without supervision.  Keep all follow-up visits as told by your health care provider. This is important. Contact a health care provider if:  You continue to have nausea or vomiting at home, and medicines are not helpful.  You cannot drink fluids or start eating again.  You cannot urinate after 8-12 hours.  You develop a skin rash.  You have fever.  You have increasing redness at the site of your procedure. Get help right away if:  You have difficulty breathing.  You have chest pain.  You have unexpected bleeding.  You feel that you are having a life-threatening or urgent problem. This information is not intended to replace advice given to you by your health care provider. Make sure you discuss any questions you have with your health care provider. Document Released: 09/04/2000 Document Revised: 11/01/2015 Document Reviewed: 05/13/2015 Elsevier Interactive Patient Education  2017 [employer].

## 2016-08-04 ENCOUNTER — Encounter (HOSPITAL_COMMUNITY)
Admission: RE | Admit: 2016-08-04 | Discharge: 2016-08-04 | Disposition: A | Discharge status: Home / Self Care | Payer: Medicaid Other | Source: Ambulatory Visit | Attending: Obstetrics & Gynecology | Admitting: Obstetrics & Gynecology

## 2016-08-04 ENCOUNTER — Encounter (HOSPITAL_COMMUNITY): Payer: Self-pay

## 2016-08-04 ENCOUNTER — Ambulatory Visit: Payer: Medicaid Other | Admitting: Obstetrics & Gynecology

## 2016-08-04 DIAGNOSIS — Z01812 Encounter for preprocedural laboratory examination: Secondary | ICD-10-CM | POA: Diagnosis present

## 2016-08-04 DIAGNOSIS — G43909 Migraine, unspecified, not intractable, without status migrainosus: Secondary | ICD-10-CM | POA: Diagnosis not present

## 2016-08-04 DIAGNOSIS — Z8481 Family history of carrier of genetic disease: Secondary | ICD-10-CM | POA: Insufficient documentation

## 2016-08-04 LAB — CBC
HEMATOCRIT: 35.1 % — AB (ref 36.0–46.0)
HEMOGLOBIN: 11 g/dL — AB (ref 12.0–15.0)
MCH: 28.4 pg (ref 26.0–34.0)
MCHC: 31.3 g/dL (ref 30.0–36.0)
MCV: 90.7 fL (ref 78.0–100.0)
PLATELETS: 305 10*3/uL (ref 150–400)
RBC: 3.87 MIL/uL (ref 3.87–5.11)
RDW: 14.1 % (ref 11.5–15.5)
WBC: 5.9 10*3/uL (ref 4.0–10.5)

## 2016-08-04 LAB — URINALYSIS, ROUTINE W REFLEX MICROSCOPIC
Bilirubin Urine: NEGATIVE
Glucose, UA: NEGATIVE mg/dL
Hgb urine dipstick: NEGATIVE
KETONES UR: NEGATIVE mg/dL
LEUKOCYTES UA: NEGATIVE
NITRITE: NEGATIVE
PH: 5 (ref 5.0–8.0)
PROTEIN: NEGATIVE mg/dL
Specific Gravity, Urine: 1.028 (ref 1.005–1.030)

## 2016-08-04 LAB — HCG, QUANTITATIVE, PREGNANCY: hCG, Beta Chain, Quant, S: <1 m[IU]/mL (ref <5)

## 2016-08-05 LAB — COMPREHENSIVE METABOLIC PANEL
ALK PHOS: 64 U/L (ref 38–126)
ALT: 20 U/L (ref 14–54)
AST: 15 U/L (ref 15–41)
Albumin: 3.9 g/dL (ref 3.5–5.0)
Anion gap: 7 (ref 5–15)
BILIRUBIN TOTAL: 0.3 mg/dL (ref 0.3–1.2)
BUN: 10 mg/dL (ref 6–20)
CALCIUM: 8.7 mg/dL — AB (ref 8.9–10.3)
CO2: 24 mmol/L (ref 22–32)
CREATININE: 0.77 mg/dL (ref 0.44–1.00)
Chloride: 108 mmol/L (ref 101–111)
GFR calc Af Amer: >60 mL/min (ref >60)
GLUCOSE: 87 mg/dL (ref 65–99)
POTASSIUM: 3.8 mmol/L (ref 3.5–5.1)
Sodium: 139 mmol/L (ref 135–145)
TOTAL PROTEIN: 6.6 g/dL (ref 6.5–8.1)

## 2016-08-09 ENCOUNTER — Ambulatory Visit (HOSPITAL_COMMUNITY)
Admission: RE | Admit: 2016-08-09 | Payer: Medicaid Other | Source: Ambulatory Visit | Admitting: Obstetrics & Gynecology

## 2016-08-09 ENCOUNTER — Encounter (HOSPITAL_COMMUNITY): Admission: RE | Payer: Self-pay | Source: Ambulatory Visit

## 2016-08-09 SURGERY — LIGATION, FALLOPIAN TUBE, LAPAROSCOPIC
Anesthesia: General | Laterality: Bilateral

## 2016-08-17 ENCOUNTER — Encounter: Payer: Medicaid Other | Admitting: Obstetrics & Gynecology

## 2016-08-17 ENCOUNTER — Encounter (HOSPITAL_COMMUNITY): Payer: Medicaid Other

## 2016-08-17 ENCOUNTER — Encounter (HOSPITAL_COMMUNITY): Payer: Self-pay | Admitting: Genetic Counselor

## 2016-08-17 ENCOUNTER — Encounter (HOSPITAL_COMMUNITY): Payer: Medicaid Other | Attending: Genetic Counselor | Admitting: Genetic Counselor

## 2016-08-17 DIAGNOSIS — Z8481 Family history of carrier of genetic disease: Secondary | ICD-10-CM

## 2016-08-17 DIAGNOSIS — Z315 Encounter for genetic counseling: Secondary | ICD-10-CM

## 2016-08-17 DIAGNOSIS — Z803 Family history of malignant neoplasm of breast: Secondary | ICD-10-CM

## 2016-08-17 NOTE — Progress Notes (Signed)
REFERRING PROVIDER: Tania Ade, MD  PRIMARY PROVIDER:  [facility]  PRIMARY REASON FOR VISIT:  1. FH: BRCA gene positive   2. Family history of breast cancer      HISTORY OF PRESENT ILLNESS:   Ms. Christianson, a 31 y.o. female, was seen for a [facility] cancer genetics consultation at the request of Dr. Elonda Husky due to a family history of cancer.  Ms. Kasson presents to clinic today to discuss the possibility of a hereditary predisposition to cancer, genetic testing, and to further clarify her future cancer risks, as well as potential cancer risks for family members.  Ms. Snooks is a 31 y.o. female with no personal history of cancer.  She is interested in learning about her risk for having a BRCA1 mutation as she will need to start undergoing screening if she is positive.  CANCER HISTORY:   No history exists.     HORMONAL RISK FACTORS:  Menarche was at age 50.  First live birth at age 49.  OCP use for approximately 0 years.  Ovaries intact: yes.  Hysterectomy: no.  Menopausal status: premenopausal.  HRT use: 0 years. Colonoscopy: no; not examined. Mammogram within the last year: no. Number of breast biopsies: 0. Up to date with pelvic exams:  yes. Any excessive radiation exposure in the past:  no  Past Medical History:  Diagnosis Date  . Family history of breast cancer   . Migraine     Past Surgical History:  Procedure Laterality Date  . HERNIA REPAIR Right    INGUINAL    Social History   Social History  . Marital status: Married    Spouse name: N/A  . Number of children: N/A  . Years of education: N/A   Social History Main Topics  . Smoking status: Never Smoker  . Smokeless tobacco: Never Used  . Alcohol use No  . Drug use: No  . Sexual activity: Yes    Birth control/ protection: None   Other Topics Concern  . None   Social History Narrative  . None     FAMILY HISTORY:  We obtained a detailed, 4-generation family history.  Significant diagnoses  are listed below: Family History  Problem Relation Age of Onset  . Hypertension Mother   . BRCA 1/2 Mother     BRCA1 c.310G>A  . Cancer Maternal Grandmother     breast  . BRCA 1/2 Maternal Aunt     BRCA1 c.310G>A    The patient has a daughter and two sons who are healthy.  Her sister is also cancer free.  Her father is alive, but she does not have contact and does not know any information about his side of the family.  Her mother tested positive for a BRCA1 mutation at [facility].  Her mother has one brother and three sisters.  The patient is aware that one sister has tested positive, as well as that sister's daughter. The mutation is coming from the patient's maternal grandfather's side of the family.  He has not had cancer.  However, Ms. Snowden grandmother had breast cancer at 60 and her brother had a cancer that now has mets to the lung.  Patient's maternal ancestors are of African American descent, and paternal ancestors are of African American descent. There is no reported Ashkenazi Jewish ancestry. There is no known consanguinity.  GENETIC COUNSELING ASSESSMENT: Canisha Issac is a 31 y.o. female with a family history of breast cancer and a known family mutation  in [place]. We, therefore, discussed and recommended the following at today's visit.   DISCUSSION: We discussed that about 5-10% of breast cancer is hereditary with most cases due to BRCA mutations.  The patient's mother was found to carry a familial BRCA1 mutation.  This places the patient at a 50% chance of also having this mutation.  We discussed that if she tests positive we will need to see her back to discuss management changes based on her genetic testing.  We also discussed that between 2-6% of patients with one hereditary cancer mutation are identified with a second mutation.  Based on her grandmother having a diagnosis of breast cancer. along with the maternal great uncle affected with cancer and lack of information  on the paternal side of the family, we discussed having a larger genetic test performed.  We reviewed the characteristics, features and inheritance patterns of hereditary cancer syndromes. We also discussed genetic testing, including the appropriate family members to test, the process of testing, insurance coverage and turn-around-time for results. We discussed the implications of a negative, positive and/or variant of uncertain significant result. We recommended Ms. Seibold pursue genetic testing for the Common Hereditary Cancer gene panel. The Hereditary Gene Panel offered by [facility] includes sequencing and/or deletion duplication testing of the following 43 genes: APC, ATM, AXIN2, BARD1, BMPR1A, BRCA1, BRCA2, BRIP1, CDH1, CDKN2A (p14ARF), CDKN2A (p16INK4a), CHEK2, DICER1, EPCAM (Deletion/duplication testing only), GREM1 (promoter region deletion/duplication testing only), KIT, MEN1, MLH1, MSH2, MSH6, MUTYH, NBN, NF1, PALB2, PDGFRA, PMS2, POLD1, POLE, PTEN, RAD50, RAD51C, RAD51D, SDHB, SDHC, SDHD, SMAD4, SMARCA4. STK11, TP53, TSC1, TSC2, and VHL.  The following gene was evaluated for sequence changes only: SDHA and HOXB13 c.251G>A variant only.  Based on Ms. Khawaja's family history of cancer, she meets medical criteria for genetic testing. Despite that she meets criteria, she may still have an out of pocket cost. We discussed that if her out of pocket cost for testing is over $100, the laboratory will call and confirm whether she wants to proceed with testing.  If the out of pocket cost of testing is less than $100 she will be billed by the genetic testing laboratory.   PLAN: After considering the risks, benefits, and limitations, Ms. Uyeno  provided informed consent to pursue genetic testing and the blood sample was sent to [facility] for analysis of the Common Hereditary Cancer panel. Results should be available within approximately 2-3 weeks' time, at which point they will be disclosed by telephone to Ms.  Hennen, as will any additional recommendations warranted by these results. Ms. Carrithers will receive a summary of her genetic counseling visit and a copy of her results once available. This information will also be available in Epic. We encouraged Ms. Kohan to remain in contact with cancer genetics annually so that we can continuously update the family history and inform her of any changes in cancer genetics and testing that may be of benefit for her family. Ms. Wiersma questions were answered to her satisfaction today. Our contact information was provided should additional questions or concerns arise.  Lastly, we encouraged Ms. Babineaux to remain in contact with cancer genetics annually so that we can continuously update the family history and inform her of any changes in cancer genetics and testing that may be of benefit for this family.   Ms.  Parslow questions were answered to her satisfaction today. Our contact information was provided should additional questions or concerns arise. Thank you for the referral and allowing [place] to  share in the care of your patient.    P. Florene Glen, [place], [facility] Certified Genetic Counselor [email or web address] phone: [phone]  The patient was seen for a total of 60 minutes in face-to-face genetic counseling.  This patient was discussed with Drs. Magrinat, Lindi Adie and/or Burr Medico who agrees with the above.    _______________________________________________________________________ For Office Staff:  Number of people involved in session: 1 Was an Intern/ student involved with case: no

## 2016-09-05 ENCOUNTER — Encounter: Payer: Self-pay | Admitting: Genetic Counselor

## 2016-09-05 ENCOUNTER — Telehealth: Payer: Self-pay | Admitting: Genetic Counselor

## 2016-09-05 DIAGNOSIS — Z1379 Encounter for other screening for genetic and chromosomal anomalies: Secondary | ICD-10-CM | POA: Insufficient documentation

## 2016-09-05 NOTE — Telephone Encounter (Signed)
Left good news message on VM.  Asked that she CB. 

## 2016-09-12 ENCOUNTER — Encounter (HOSPITAL_COMMUNITY): Payer: Self-pay

## 2016-09-14 ENCOUNTER — Telehealth: Payer: Self-pay | Admitting: Genetic Counselor

## 2016-09-14 ENCOUNTER — Ambulatory Visit: Payer: Self-pay | Admitting: Genetic Counselor

## 2016-09-14 DIAGNOSIS — Z1379 Encounter for other screening for genetic and chromosomal anomalies: Secondary | ICD-10-CM

## 2016-09-14 DIAGNOSIS — Z8481 Family history of carrier of genetic disease: Secondary | ICD-10-CM

## 2016-09-14 DIAGNOSIS — Z803 Family history of malignant neoplasm of breast: Secondary | ICD-10-CM

## 2016-09-14 NOTE — Telephone Encounter (Signed)
LM on VM with good news on results. Asked to please CB.

## 2016-09-14 NOTE — Progress Notes (Signed)
HPI: Regina Davis was previously seen in the [place] clinic due to a family history of cancer, a known familial mutation in [place] and concerns regarding a hereditary predisposition to cancer. Please refer to our prior cancer genetics clinic note for more information regarding Regina Davis's medical, social and family histories, and our assessment and recommendations, at the time. Regina Davis recent genetic test results were disclosed to her, as were recommendations warranted by these results. These results and recommendations are discussed in more detail below.  CANCER HISTORY:   No history exists.    FAMILY HISTORY:  We obtained a detailed, 4-generation family history.  Significant diagnoses are listed below: Family History  Problem Relation Age of Onset  . Hypertension Mother   . BRCA 1/2 Mother     BRCA1 c.310G>A  . Cancer Maternal Grandmother     breast  . BRCA 1/2 Maternal Aunt     BRCA1 c.310G>A    The patient has a daughter and two sons who are healthy.  Her sister is also cancer free.  Her father is alive, but she does not have contact and does not know any information about his side of the family.  Her mother tested positive for a BRCA1 mutation at [facility].  Her mother has one brother and three sisters.  The patient is aware that one sister has tested positive, as well as that sister's daughter. The mutation is coming from the patient's maternal grandfather's side of the family.  He has not had cancer.  However, Regina Davis grandmother had breast cancer at 64 and her brother had a cancer that now has mets to the lung.  Patient's maternal ancestors are of African American descent, and paternal ancestors are of African American descent. There is no reported Ashkenazi Jewish ancestry. There is no known consanguinity.  GENETIC TEST RESULTS: Genetic testing reported out on September 04, 2016 through the Common Hereditary cancer panel found no deleterious mutations.  Specifically, Regina Davis test was normal and did not reveal the familial mutation. We call this result a true negative result because the cancer-causing mutation was identified in Regina Davis's family, and she did not inherit it.  Given this negative result, Regina Davis chances of developing BRCA-related cancers are the same as they are in the general population.  The Hereditary Gene Panel offered by [facility] includes sequencing and/or deletion duplication testing of the following 43 genes: APC, ATM, AXIN2, BARD1, BMPR1A, BRCA1, BRCA2, BRIP1, CDH1, CDKN2A (p14ARF), CDKN2A (p16INK4a), CHEK2, DICER1, EPCAM (Deletion/duplication testing only), GREM1 (promoter region deletion/duplication testing only), KIT, MEN1, MLH1, MSH2, MSH6, MUTYH, NBN, NF1, PALB2, PDGFRA, PMS2, POLD1, POLE, PTEN, RAD50, RAD51C, RAD51D, SDHB, SDHC, SDHD, SMAD4, SMARCA4. STK11, TP53, TSC1, TSC2, and VHL.  The following gene was evaluated for sequence changes only: SDHA and HOXB13 c.251G>A variant only.  The test report has been scanned into EPIC and is located under the Molecular Pathology section of the Results Review tab.     We discussed with Regina Davis that since the current genetic testing is not perfect, it is possible there may be a gene mutation in one of these genes that current testing cannot detect, but that chance is small. We also discussed, that it is possible that another gene that has not yet been discovered, or that we have not yet tested, is responsible for the cancer diagnoses in the family, and it is, therefore, important to remain in touch with cancer genetics in the future so that we  can continue to offer Regina Davis the most up to date genetic testing.   CANCER SCREENING RECOMMENDATIONS:  This normal result is reassuring and indicates that Regina Davis does not likely have an increased risk of cancer due to a mutation in one of these genes.  We, therefore, recommended  Regina Davis continue to follow the cancer screening guidelines provided  by her primary healthcare providers.   RECOMMENDATIONS FOR FAMILY MEMBERS: Women in this family might be at some increased risk of developing cancer, over the general population risk, simply due to the family history of cancer. We recommended women in this family have a yearly mammogram beginning at age 9, or 34 years younger than the earliest onset of cancer, an annual clinical breast exam, and perform monthly breast self-exams. Women in this family should also have a gynecological exam as recommended by their primary provider. All family members should have a colonoscopy by age 26.  FOLLOW-UP: Lastly, we discussed with Regina Davis that cancer genetics is a rapidly advancing field and it is possible that new genetic tests will be appropriate for her and/or her family members in the future. We encouraged her to remain in contact with cancer genetics on an annual basis so we can update her personal and family histories and let her know of advances in cancer genetics that may benefit this family.   Our contact number was provided. Regina Davis questions were answered to her satisfaction, and she knows she is welcome to call us at anytime with additional questions or concerns.   Roma Kayser, MS, [facility] Certified Genetic Counselor [email or web address]

## 2016-09-14 NOTE — Telephone Encounter (Signed)
Revealed negative genetic testing. Discussed that she does not have the BRCA1 known family mutation that has been identified in her mother.  This is a true negative.

## 2016-12-21 ENCOUNTER — Other Ambulatory Visit: Payer: Self-pay | Admitting: Women's Health

## 2019-01-20 ENCOUNTER — Other Ambulatory Visit: Payer: Self-pay

## 2019-01-20 ENCOUNTER — Emergency Department (HOSPITAL_COMMUNITY)
Admission: EM | Admit: 2019-01-20 | Discharge: 2019-01-20 | Disposition: A | Discharge status: Home / Self Care | Payer: 59 | Attending: Emergency Medicine | Admitting: Emergency Medicine

## 2019-01-20 ENCOUNTER — Encounter (HOSPITAL_COMMUNITY): Payer: Self-pay | Admitting: *Deleted

## 2019-01-20 DIAGNOSIS — H538 Other visual disturbances: Secondary | ICD-10-CM | POA: Diagnosis present

## 2019-01-20 DIAGNOSIS — Z79899 Other long term (current) drug therapy: Secondary | ICD-10-CM | POA: Diagnosis not present

## 2019-01-20 DIAGNOSIS — H1131 Conjunctival hemorrhage, right eye: Secondary | ICD-10-CM | POA: Insufficient documentation

## 2019-01-20 LAB — CBC WITH DIFFERENTIAL/PLATELET
Abs Immature Granulocytes: 0.03 10*3/uL (ref 0.00–0.07)
Basophils Absolute: 0 10*3/uL (ref 0.0–0.1)
Basophils Relative: 0 %
Eosinophils Absolute: 0.3 10*3/uL (ref 0.0–0.5)
Eosinophils Relative: 3 %
HCT: 37.4 % (ref 36.0–46.0)
Hemoglobin: 11.4 g/dL — ABNORMAL LOW (ref 12.0–15.0)
Immature Granulocytes: 0 %
Lymphocytes Relative: 23 %
Lymphs Abs: 2.3 10*3/uL (ref 0.7–4.0)
MCH: 28.1 pg (ref 26.0–34.0)
MCHC: 30.5 g/dL (ref 30.0–36.0)
MCV: 92.1 fL (ref 80.0–100.0)
Monocytes Absolute: 0.4 10*3/uL (ref 0.1–1.0)
Monocytes Relative: 4 %
Neutro Abs: 6.7 10*3/uL (ref 1.7–7.7)
Neutrophils Relative %: 70 %
Platelets: 354 10*3/uL (ref 150–400)
RBC: 4.06 MIL/uL (ref 3.87–5.11)
RDW: 13.1 % (ref 11.5–15.5)
WBC: 9.8 10*3/uL (ref 4.0–10.5)
nRBC: 0 % (ref 0.0–0.2)

## 2019-01-20 NOTE — ED Triage Notes (Signed)
Sclera to pt's R eye has been red since Friday - she denies pain.  However, today her vision became blurry in the R eye.

## 2019-01-20 NOTE — ED Provider Notes (Signed)
[facility] EMERGENCY DEPARTMENT Provider Note   CSN: [id number] Arrival date & time: 01/20/19  1336    History   Chief Complaint Chief Complaint  Patient presents with  . Blurred Vision    HPI Regina Davis is a 33 y.o. female.     33yo female presents with complaint of redness of the right eye, onset Friday, denies trauma to the eye, sneezing/coughing/vomiting prior to the redness starting. Patient states she has had this happen before, was not concerned about this until today when she noticed the redness moving from the inferior nasal area of the sclera to the inferior/lateral aspect associated with blurry vision while working on her computer. Denies eye pain, headache, unilateral weakness, changes in speech or gait. No other complaints or concerns.      Past Medical History:  Diagnosis Date  . Family history of breast cancer   . Migraine     Patient Active Problem List   Diagnosis Date Noted  . Genetic testing 09/05/2016  . Family history of breast cancer   . FH: BRCA gene positive 07/03/2016  . Irritation of vulva 01/18/2016  . Migraine     Past Surgical History:  Procedure Laterality Date  . HERNIA REPAIR Right    INGUINAL     OB History    Gravida  3   Para  3   Term  3   Preterm      AB      Living  3     SAB      TAB      Ectopic      Multiple  0   Live Births  3            Home Medications    Prior to Admission medications   Medication Sig Start Date End Date Taking? Authorizing Provider  Biotin w/ Vitamins C & E (HAIR SKIN & NAILS GUMMIES PO) Take 1 tablet by mouth at bedtime.   Yes [provider]  Multiple Vitamins-Minerals (AIRBORNE PO) Take 1 tablet by mouth daily. Dissolve tablet in water and drink   Yes [provider]  Naphazoline HCl (CLEAR EYES OP) Place 1 drop into the right eye daily as needed (irritation).   Yes [provider]  Prenatal Vit-Fe Fumarate-FA  (PREPLUS) 27-1 MG TABS TAKE ONE TABLET BY MOUTH ONCE DAILY AT  12  NOON Patient taking differently: Take 1 tablet by mouth at bedtime.  12/21/16  Yes Roma Schanz, CNM    Family History Family History  Problem Relation Age of Onset  . Hypertension Mother   . BRCA 1/2 Mother        BRCA1 c.310G>A  . Cancer Maternal Grandmother        breast  . BRCA 1/2 Maternal Aunt        BRCA1 c.310G>A    Social History Social History   Tobacco Use  . Smoking status: Never Smoker  . Smokeless tobacco: Never Used  Substance Use Topics  . Alcohol use: No  . Drug use: No     Allergies   Patient has no known allergies.   Review of Systems Review of Systems  Constitutional: Negative for chills and fever.  HENT: Negative for sneezing.   Eyes: Positive for redness and visual disturbance. Negative for photophobia and pain.  Respiratory: Negative for cough.   Gastrointestinal: Negative for vomiting.  Skin: Negative for rash and wound.  Allergic/Immunologic: Negative for immunocompromised state.  Neurological:  Negative for dizziness, facial asymmetry, speech difficulty, weakness and headaches.  Hematological: Does not bruise/bleed easily.  Psychiatric/Behavioral: Negative for confusion.  All other systems reviewed and are negative.    Physical Exam Updated Vital Signs BP 127/79 (BP Location: Right Arm)   Pulse 78   Temp 99.4 F (37.4 C) (Oral)   Resp 20   LMP 01/01/2019   SpO2 97%   Physical Exam Vitals signs and nursing note reviewed.  Constitutional:      General: She is not in acute distress.    Appearance: She is well-developed. She is not diaphoretic.  HENT:     Head: Normocephalic and atraumatic.  Eyes:     Extraocular Movements: Extraocular movements intact.     Conjunctiva/sclera:     Right eye: Hemorrhage present.     Left eye: No hemorrhage.    Pupils: Pupils are equal, round, and reactive to light.     Slit lamp exam:    Right eye: No hyphema.     Left  eye: No hyphema.  Pulmonary:     Effort: Pulmonary effort is normal.  Musculoskeletal:     Right lower leg: No edema.     Left lower leg: No edema.  Skin:    General: Skin is warm and dry.     Findings: No erythema or rash.  Neurological:     Mental Status: She is alert and oriented to person, place, and time.     Gait: Gait normal.  Psychiatric:        Behavior: Behavior normal.      ED Treatments / Results  Labs (all labs ordered are listed, but only abnormal results are displayed) Labs Reviewed  CBC WITH DIFFERENTIAL/PLATELET - Abnormal; Notable for the following components:      Result Value   Hemoglobin 11.4 (*)    All other components within normal limits    EKG None  Radiology No results found.  Procedures Procedures (including critical care time)  Medications Ordered in ED Medications - No data to display   Initial Impression / Assessment and Plan / ED Course  I have reviewed the triage vital signs and the nursing notes.  Pertinent labs & imaging results that were available during my care of the patient were reviewed by me and considered in my medical decision making (see chart for details).  Clinical Course as of Jan 20 1643  Mon Jan 19, 4254  4080 33 year old female presents with complaint of redness in the white part of her eye on the right x3 days.  Patient became concerned today when she noticed that the redness seemed to be spreading and she experienced blurred vision while working at her computer.  Patient is found to have normal vision, 20/20 in each eye and together.  On exam she has a subconjunctival hemorrhage to the inferior nasal aspect of the right sclera extending inferiorly and slightly to the temporal portion.  No blood noted in the anterior chamber, pupils are equal and reactive and exam is otherwise unremarkable.  CBC is reassuring with normal platelets.  Recommend patient follow-up with her primary care provider if her symptoms persist.    [LM]    Clinical Course User Index [LM] Tacy Learn, PA-C      Final Clinical Impressions(s) / ED Diagnoses   Final diagnoses:  None    ED Discharge Orders    None       Roque Lias 01/20/19 1644    Dorie Rank,  MD 01/22/19 [phone]

## 2020-07-08 ENCOUNTER — Other Ambulatory Visit: Payer: Self-pay | Admitting: Nurse Practitioner

## 2020-07-08 DIAGNOSIS — Z9189 Other specified personal risk factors, not elsewhere classified: Secondary | ICD-10-CM

## 2023-03-27 ENCOUNTER — Ambulatory Visit: Payer: Self-pay

## 2023-03-27 NOTE — Telephone Encounter (Signed)
Per agent pt was having lower abdominal pain. Pt hung up prior to transfer.

## 2023-03-27 NOTE — Telephone Encounter (Signed)
Mailbox was full unable to leave a voicemail.

## 2023-06-27 ENCOUNTER — Other Ambulatory Visit: Payer: Self-pay | Admitting: Nurse Practitioner

## 2023-06-27 DIAGNOSIS — Z1231 Encounter for screening mammogram for malignant neoplasm of breast: Secondary | ICD-10-CM

## 2023-07-27 ENCOUNTER — Encounter: Payer: Self-pay | Admitting: Radiology

## 2023-07-27 ENCOUNTER — Ambulatory Visit
Admission: RE | Admit: 2023-07-27 | Discharge: 2023-07-27 | Disposition: A | Discharge status: Home / Self Care | Payer: Medicaid Other | Source: Ambulatory Visit | Attending: Nurse Practitioner | Admitting: Nurse Practitioner

## 2023-07-27 DIAGNOSIS — Z1231 Encounter for screening mammogram for malignant neoplasm of breast: Secondary | ICD-10-CM

## 2023-07-30 ENCOUNTER — Ambulatory Visit: Payer: 59

## 2023-12-01 ENCOUNTER — Encounter (HOSPITAL_COMMUNITY): Payer: Self-pay | Admitting: *Deleted

## 2023-12-01 ENCOUNTER — Emergency Department (HOSPITAL_COMMUNITY)
Admission: EM | Admit: 2023-12-01 | Discharge: 2023-12-01 | Disposition: A | Discharge status: Home / Self Care | Attending: Emergency Medicine | Admitting: Emergency Medicine

## 2023-12-01 ENCOUNTER — Emergency Department (HOSPITAL_COMMUNITY)

## 2023-12-01 ENCOUNTER — Other Ambulatory Visit: Payer: Self-pay

## 2023-12-01 DIAGNOSIS — R202 Paresthesia of skin: Secondary | ICD-10-CM | POA: Diagnosis not present

## 2023-12-01 DIAGNOSIS — R519 Headache, unspecified: Secondary | ICD-10-CM | POA: Diagnosis present

## 2023-12-01 DIAGNOSIS — R200 Anesthesia of skin: Secondary | ICD-10-CM | POA: Diagnosis not present

## 2023-12-01 LAB — CBC WITH DIFFERENTIAL/PLATELET
Abs Immature Granulocytes: 0.04 10*3/uL (ref 0.00–0.07)
Basophils Absolute: 0 10*3/uL (ref 0.0–0.1)
Basophils Relative: 0 %
Eosinophils Absolute: 0.3 10*3/uL (ref 0.0–0.5)
Eosinophils Relative: 3 %
HCT: 37.6 % (ref 36.0–46.0)
Hemoglobin: 11.4 g/dL — ABNORMAL LOW (ref 12.0–15.0)
Immature Granulocytes: 0 %
Lymphocytes Relative: 24 %
Lymphs Abs: 2.5 10*3/uL (ref 0.7–4.0)
MCH: 26.4 pg (ref 26.0–34.0)
MCHC: 30.3 g/dL (ref 30.0–36.0)
MCV: 87 fL (ref 80.0–100.0)
Monocytes Absolute: 0.5 10*3/uL (ref 0.1–1.0)
Monocytes Relative: 5 %
Neutro Abs: 7.1 10*3/uL (ref 1.7–7.7)
Neutrophils Relative %: 68 %
Platelets: 399 10*3/uL (ref 150–400)
RBC: 4.32 MIL/uL (ref 3.87–5.11)
RDW: 13.9 % (ref 11.5–15.5)
WBC: 10.4 10*3/uL (ref 4.0–10.5)
nRBC: 0 % (ref 0.0–0.2)

## 2023-12-01 LAB — BASIC METABOLIC PANEL WITH GFR
Anion gap: 10 (ref 5–15)
BUN: 7 mg/dL (ref 6–20)
CO2: 24 mmol/L (ref 22–32)
Calcium: 9.2 mg/dL (ref 8.9–10.3)
Chloride: 106 mmol/L (ref 98–111)
Creatinine, Ser: 0.9 mg/dL (ref 0.44–1.00)
GFR, Estimated: >60 mL/min (ref >60)
Glucose, Bld: 92 mg/dL (ref 70–99)
Potassium: 3.5 mmol/L (ref 3.5–5.1)
Sodium: 140 mmol/L (ref 135–145)

## 2023-12-01 LAB — MAGNESIUM: Magnesium: 2.1 mg/dL (ref 1.7–2.4)

## 2023-12-01 LAB — HCG, SERUM, QUALITATIVE: Preg, Serum: NEGATIVE

## 2023-12-01 NOTE — Discharge Instructions (Signed)
 Return for any problem.  ?

## 2023-12-01 NOTE — ED Triage Notes (Signed)
 Pt states she has had numbness in hands and arms, then lip, tongue and head, Wednesday had dizziness lasting 30-60 minutes, Blurred vision started this past Monday and has been coming and going, Migraines also started on Monday in the mornings that goes away later.

## 2023-12-01 NOTE — ED Provider Notes (Signed)
 [place] EMERGENCY DEPARTMENT AT [facility] Provider Note   CSN: [id number] Arrival date & time: 12/01/23  1513     Patient presents with: Numbness, Migraines, and Eye Problem   Regina Davis is a 38 y.o. female.   39 year old female with prior medical history as detailed below presents for evaluation.  Patient with longstanding history of migraine headaches.  Patient reports that her typical migraine starts with visual scotoma and then within approximately 30 minutes she gets a diffuse headache.  Her last migraine reportedly was several years ago.  Last night she had symptoms of her typical migraine headache.  She went to bed.  This morning she woke up with mild residual headache and associated tingling and numbness in her hands and arms and lip and tongue.  The symptoms have now resolved.  She denies current headache.  She denies current visual change.  She denies current focal weakness.  She reports that she typically does not take any medications for migraines.  She reports they just go away and  I put up with it.  She apparently googled her symptoms today.  She is concerned that she may have a blood clot in my brain.  The history is provided by the patient and medical records.       Prior to Admission medications   Medication Sig Start Date End Date Taking? Authorizing Provider  Biotin w/ Vitamins C & E (HAIR SKIN & NAILS GUMMIES PO) Take 1 tablet by mouth at bedtime.    [provider]  Multiple Vitamins-Minerals (AIRBORNE PO) Take 1 tablet by mouth daily. Dissolve tablet in water and drink    [provider]  Naphazoline HCl (CLEAR EYES OP) Place 1 drop into the right eye daily as needed (irritation).    [provider]  Prenatal Vit-Fe Fumarate-FA (PREPLUS) 27-1 MG TABS TAKE ONE TABLET BY MOUTH ONCE DAILY AT  12  NOON Patient taking differently: Take 1 tablet by mouth at bedtime.  12/21/16   Regina Davis, CNM     Allergies: Patient has no known allergies.    Review of Systems  All other systems reviewed and are negative.   Updated Vital Signs BP 135/68 (BP Location: Left Arm)   Pulse 80   Temp 98 F (36.7 C) (Oral)   Resp 18   Ht 5' 7 (1.702 m)   Wt 113.4 kg   SpO2 100%   BMI 39.16 kg/m   Physical Exam Vitals and nursing note reviewed.  Constitutional:      General: She is not in acute distress.    Appearance: Normal appearance. She is well-developed.  HENT:     Head: Normocephalic and atraumatic.   Eyes:     Conjunctiva/sclera: Conjunctivae normal.     Pupils: Pupils are equal, round, and reactive to light.    Cardiovascular:     Rate and Rhythm: Normal rate and regular rhythm.     Heart sounds: Normal heart sounds.  Pulmonary:     Effort: Pulmonary effort is normal. No respiratory distress.     Breath sounds: Normal breath sounds.  Abdominal:     General: There is no distension.     Palpations: Abdomen is soft.     Tenderness: There is no abdominal tenderness.   Musculoskeletal:        General: No deformity. Normal range of motion.     Cervical back: Normal range of motion and neck supple.   Skin:    General:  Skin is warm and dry.   Neurological:     General: No focal deficit present.     Mental Status: She is alert and oriented to person, place, and time. Mental status is at baseline.     Cranial Nerves: No cranial nerve deficit.     Sensory: No sensory deficit.     Motor: No weakness.     Coordination: Coordination normal.     Gait: Gait normal.     (all labs ordered are listed, but only abnormal results are displayed) Labs Reviewed  CBC WITH DIFFERENTIAL/PLATELET - Abnormal; Notable for the following components:      Result Value   Hemoglobin 11.4 (*)    All other components within normal limits  BASIC METABOLIC PANEL WITH GFR  MAGNESIUM  HCG, SERUM, QUALITATIVE    EKG: None  Radiology: CT Head Wo Contrast Result Date: 12/01/2023 CLINICAL  DATA:  Headache, sudden severe. Numbness in hands and arms. Recent episodes of dizziness and blurred vision. EXAM: CT HEAD WITHOUT CONTRAST TECHNIQUE: Contiguous axial images were obtained from the base of the skull through the vertex without intravenous contrast. RADIATION DOSE REDUCTION: This exam was performed according to the departmental dose-optimization program which includes automated exposure control, adjustment of the mA and/or kV according to patient size and/or use of iterative reconstruction technique. COMPARISON:  None Available. FINDINGS: Brain: No acute intracranial hemorrhage. No CT evidence of acute infarct. No edema, mass effect, or midline shift. The basilar cisterns are patent. Ventricles: The ventricles are normal. Vascular: No hyperdense vessel or unexpected calcification. Skull: No acute or aggressive finding. Orbits: Orbits are symmetric. Sinuses: The visualized paranasal sinuses are clear. Other: Mastoid air cells are clear. IMPRESSION: No CT evidence of acute intracranial abnormality. Electronically Signed   By: Donnice Mania M.D.   On: 12/01/2023 17:31     Procedures   Medications Ordered in the ED - No data to display                                  Medical Decision Making Amount and/or Complexity of Data Reviewed Labs: ordered. Radiology: ordered.    Medical Screen Complete  This patient presented to the ED with complaint of headache.  This complaint involves an extensive number of treatment options. The initial differential diagnosis includes, but is not limited to, migraine, complex migraine, metabolic abnormality, etc.  This presentation is: Acute, Chronic, Self-Limited, Previously Undiagnosed, Uncertain Prognosis, Complicated, Systemic Symptoms, and Threat to Life/Bodily Function  Patient with a history of migraine headaches.  Patient reports symptoms over the last 24 hours perhaps most consistent with likely complex migraine.  She is now symptom-free.   She is requesting a CT scan.  She is concerned after googling her symptoms that she may have some more significant acute pathology.  Patient is neurologically intact.  Screening labs obtained are without significant abnormality.  Screening CT imaging is also without significant acute pathology.  Patient feels improved and reassured after evaluation.  She desires discharge home.  She understands need for close for outpatient follow-up with her PCP.  Strict return precautions given and understood.   Additional history obtained:  External records from outside sources obtained and reviewed including prior ED visits and prior Inpatient records.    Problem List / ED Course:  Headache   Reevaluation:  After the interventions noted above, I reevaluated the patient and found that they have: resolved  Disposition:  After consideration of the diagnostic results and the patients response to treatment, I feel that the patent would benefit from close outpatient follow-up.       Final diagnoses:  Nonintractable headache, unspecified chronicity pattern, unspecified headache type    ED Discharge Orders     None          Laurice Maude BROCKS, MD 12/01/23 [phone]

## 2023-12-04 ENCOUNTER — Other Ambulatory Visit: Payer: Self-pay | Admitting: Nurse Practitioner

## 2023-12-04 DIAGNOSIS — Z803 Family history of malignant neoplasm of breast: Secondary | ICD-10-CM

## 2023-12-04 DIAGNOSIS — N6321 Unspecified lump in the left breast, upper outer quadrant: Secondary | ICD-10-CM

## 2023-12-04 DIAGNOSIS — Z9189 Other specified personal risk factors, not elsewhere classified: Secondary | ICD-10-CM

## 2023-12-04 DIAGNOSIS — Z8481 Family history of carrier of genetic disease: Secondary | ICD-10-CM

## 2023-12-25 ENCOUNTER — Ambulatory Visit
Admission: RE | Admit: 2023-12-25 | Discharge: 2023-12-25 | Disposition: A | Discharge status: Home / Self Care | Source: Ambulatory Visit | Attending: Nurse Practitioner | Admitting: Nurse Practitioner

## 2023-12-25 DIAGNOSIS — Z8481 Family history of carrier of genetic disease: Secondary | ICD-10-CM

## 2023-12-25 DIAGNOSIS — N6321 Unspecified lump in the left breast, upper outer quadrant: Secondary | ICD-10-CM

## 2023-12-25 DIAGNOSIS — Z9189 Other specified personal risk factors, not elsewhere classified: Secondary | ICD-10-CM

## 2023-12-25 DIAGNOSIS — Z803 Family history of malignant neoplasm of breast: Secondary | ICD-10-CM

## 2023-12-28 ENCOUNTER — Encounter: Payer: Self-pay | Admitting: Advanced Practice Midwife

## 2023-12-28 ENCOUNTER — Other Ambulatory Visit: Payer: Self-pay | Admitting: Nurse Practitioner

## 2023-12-28 DIAGNOSIS — Z8481 Family history of carrier of genetic disease: Secondary | ICD-10-CM

## 2023-12-28 DIAGNOSIS — Z803 Family history of malignant neoplasm of breast: Secondary | ICD-10-CM

## 2023-12-28 DIAGNOSIS — Z9189 Other specified personal risk factors, not elsewhere classified: Secondary | ICD-10-CM

## 2024-01-18 ENCOUNTER — Ambulatory Visit
Admission: RE | Admit: 2024-01-18 | Discharge: 2024-01-18 | Disposition: A | Discharge status: Home / Self Care | Source: Ambulatory Visit | Attending: Family Medicine | Admitting: Family Medicine

## 2024-01-18 VITALS — BP 129/82 | HR 91 | Temp 98.5°F | Resp 16

## 2024-01-18 DIAGNOSIS — H6501 Acute serous otitis media, right ear: Secondary | ICD-10-CM | POA: Diagnosis not present

## 2024-01-18 DIAGNOSIS — J069 Acute upper respiratory infection, unspecified: Secondary | ICD-10-CM | POA: Diagnosis not present

## 2024-01-18 DIAGNOSIS — R051 Acute cough: Secondary | ICD-10-CM | POA: Diagnosis not present

## 2024-01-18 LAB — POC COVID19/FLU A&B COMBO
Covid Antigen, POC: NEGATIVE
Influenza A Antigen, POC: NEGATIVE
Influenza B Antigen, POC: NEGATIVE

## 2024-01-18 MED ORDER — AMOXICILLIN 875 MG PO TABS: 875.0000 mg | ORAL_TABLET | Freq: Two times a day (BID) | ORAL | 0 refills | Status: AC

## 2024-01-18 NOTE — Discharge Instructions (Addendum)
 You have tested negative for COVID and flu.  Start amoxicillin  twice daily for 10 days to treat your ear infection.  He may continue over-the-counter Tylenol  ibuprofen  and cough medicine as needed.  Lots of rest and fluids.  Please follow-up with your PCP if your symptoms do not improve.  Please go to the ER for any worsening symptoms.  Hope you feel better soon!

## 2024-01-18 NOTE — ED Provider Notes (Signed)
 [facility]    CSN: [id number] Arrival date & time: 01/18/24  1348      History   Chief Complaint Chief Complaint  Patient presents with   Ear Fullness    Eat pain - Entered by patient    HPI Regina Davis is a 38 y.o. female  presents for evaluation of URI symptoms for 3 days. Patient reports associated symptoms of cough, congestion, sinus pressure, fever, ear pain. Denies N/V/D, body aches, sore throat, or shortness of breath. Patient does not have a hx of asthma. Patient is not an active smoker.   Daughter has similar symptoms.  At pt has taken DayQuil OTC for symptoms. Pt has no other concerns at this time.    Ear Fullness    Past Medical History:  Diagnosis Date   Family history of breast cancer    Migraine     Patient Active Problem List   Diagnosis Date Noted   Genetic testing 09/05/2016   Family history of breast cancer    FH: BRCA gene positive 07/03/2016   Irritation of vulva 01/18/2016   Migraine     Past Surgical History:  Procedure Laterality Date   HERNIA REPAIR Right    INGUINAL    OB History     Gravida  3   Para  3   Term  3   Preterm      AB      Living  3      SAB      IAB      Ectopic      Multiple  0   Live Births  3            Home Medications    Prior to Admission medications   Medication Sig Start Date End Date Taking? Authorizing Provider  amoxicillin  (AMOXIL ) 875 MG tablet Take 1 tablet (875 mg total) by mouth 2 (two) times daily for 10 days. 01/18/24 01/28/24 Yes Zaiyah Sottile, Jodi R, NP  Biotin w/ Vitamins C & E (HAIR SKIN & NAILS GUMMIES PO) Take 1 tablet by mouth at bedtime.    [provider]  Multiple Vitamins-Minerals (AIRBORNE PO) Take 1 tablet by mouth daily. Dissolve tablet in water and drink    [provider]  Naphazoline HCl (CLEAR EYES OP) Place 1 drop into the right eye daily as needed (irritation).    [provider]  Prenatal Vit-Fe Fumarate-FA (PREPLUS) 27-1  MG TABS TAKE ONE TABLET BY MOUTH ONCE DAILY AT  12  NOON Patient taking differently: Take 1 tablet by mouth at bedtime.  12/21/16   [name], CNM    Family History Family History  Problem Relation Age of Onset   Hypertension Mother    BRCA 1/2 Mother        BRCA1 c.310G>A   Cancer Maternal Grandmother        breast   BRCA 1/2 Maternal Aunt        BRCA1 c.310G>A    Social History Social History   Tobacco Use   Smoking status: Never   Smokeless tobacco: Never  Vaping Use   Vaping status: Never Used  Substance Use Topics   Alcohol use: No   Drug use: No     Allergies   Patient has no known allergies.   Review of Systems Review of Systems  Constitutional:  Positive for fever.  HENT:  Positive for congestion and ear pain.   Respiratory:  Positive for cough.  Physical Exam Triage Vital Signs ED Triage Vitals  Encounter Vitals Group     BP 01/18/24 1435 129/82     Girls Systolic BP Percentile --      Girls Diastolic BP Percentile --      Boys Systolic BP Percentile --      Boys Diastolic BP Percentile --      Pulse Rate 01/18/24 1435 91     Resp 01/18/24 1435 16     Temp 01/18/24 1435 98.5 F (36.9 C)     Temp Source 01/18/24 1435 Oral     SpO2 01/18/24 1435 97 %     Weight --      Height --      Head Circumference --      Peak Flow --      Pain Score 01/18/24 1434 5     Pain Loc --      Pain Education --      Exclude from Growth Chart --    No data found.  Updated Vital Signs BP 129/82 (BP Location: Right Arm)   Pulse 91   Temp 98.5 F (36.9 C) (Oral)   Resp 16   LMP 01/09/2024 (Exact Date)   SpO2 97%   Visual Acuity Right Eye Distance:   Left Eye Distance:   Bilateral Distance:    Right Eye Near:   Left Eye Near:    Bilateral Near:     Physical Exam Vitals and nursing note reviewed.  Constitutional:      General: She is not in acute distress.    Appearance: She is well-developed. She is not ill-appearing.  HENT:      Head: Normocephalic and atraumatic.     Right Ear: Ear canal normal. Tympanic membrane is erythematous.     Left Ear: Ear canal normal. A middle ear effusion is present.     Nose: Congestion present.     Mouth/Throat:     Mouth: Mucous membranes are moist.     Pharynx: Oropharynx is clear. Uvula midline. No posterior oropharyngeal erythema.     Tonsils: No tonsillar exudate or tonsillar abscesses.  Eyes:     Conjunctiva/sclera: Conjunctivae normal.     Pupils: Pupils are equal, round, and reactive to light.  Cardiovascular:     Rate and Rhythm: Normal rate and regular rhythm.     Heart sounds: Normal heart sounds.  Pulmonary:     Effort: Pulmonary effort is normal.     Breath sounds: Normal breath sounds. No wheezing or rhonchi.  Musculoskeletal:     Cervical back: Normal range of motion and neck supple.  Lymphadenopathy:     Cervical: No cervical adenopathy.  Skin:    General: Skin is warm and dry.  Neurological:     General: No focal deficit present.     Mental Status: She is alert and oriented to person, place, and time.  Psychiatric:        Mood and Affect: Mood normal.        Behavior: Behavior normal.      UC Treatments / Results  Labs (all labs ordered are listed, but only abnormal results are displayed) Labs Reviewed  POC COVID19/FLU A&B COMBO   Basic metabolic panel Order: [id number]  Status: Final result     Next appt: None   Test Result Released: Yes (not seen)   0 Result Notes      Component Ref Range & Units (hover) 1 mo ago 7 yr ago 13 yr  ago  Sodium 140 139 140 R  Potassium 3.5 3.8 3.7 R  Chloride 106 108 R 105 R  CO2 24 24   Glucose, Bld 92 87 R 89  Comment: Glucose reference range applies only to samples taken after fasting for at least 8 hours.  BUN 7 10 8  R  Creatinine, Ser 0.90 0.77 0.8 R  Calcium 9.2 8.7 Low    GFR, Estimated >60    Comment: (NOTE) Calculated using the CKD-EPI Creatinine Equation (2021)  Anion gap 10 7   Comment:  Performed at [facility] [address], [place], [place] [address]  Resulting Agency [facility] CLIN LAB [facility] CLIN LAB [facility] LAB        Specimen Collected: 12/01/23 17:27 Last Resulted: 12/01/23 17:55   EKG   Radiology No results found.  Procedures Procedures (including critical care time)  Medications Ordered in UC Medications - No data to display  Initial Impression / Assessment and Plan / UC Course  I have reviewed the triage vital signs and the nursing notes.  Pertinent labs & imaging results that were available during my care of the patient were reviewed by me and considered in my medical decision making (see chart for details).     Reviewed exam and symptoms with patient.  No red flags.  Negative COVID and flu testing.  Will start amoxicillin  for right OM.  Advised viral illness and symptomatic treatment.  Encourage rest fluids and PCP follow-up if symptoms do not improve.  ER precautions reviewed. Final Clinical Impressions(s) / UC Diagnoses   Final diagnoses:  Acute cough  Non-recurrent acute serous otitis media of right ear  Viral upper respiratory infection     Discharge Instructions      You have tested negative for COVID and flu.  Start amoxicillin  twice daily for 10 days to treat your ear infection.  He may continue over-the-counter Tylenol  ibuprofen  and cough medicine as needed.  Lots of rest and fluids.  Please follow-up with your PCP if your symptoms do not improve.  Please go to the ER for any worsening symptoms.  Hope you feel better soon!     ED Prescriptions     Medication Sig Dispense Auth. Provider   amoxicillin  (AMOXIL ) 875 MG tablet Take 1 tablet (875 mg total) by mouth 2 (two) times daily for 10 days. 20 tablet Ronalda Walpole, Jodi R, NP      PDMP not reviewed this encounter.   Loreda Myla SAUNDERS, NP 01/18/24 [phone]

## 2024-01-18 NOTE — ED Triage Notes (Signed)
 Pt present with bilateral ear pain and nasal congestion x 3 days.  States she has sinus pressure and feels more pressure behind her eyes. Denies fever.  Home interventions: DayQuil and Advil
